# Patient Record
Sex: Female | Born: 1960 | Race: White | Hispanic: No | Marital: Married | Smoking: Never smoker
Health system: Southern US, Community
[De-identification: ages and names within clinical notes are randomized; demographics above are authoritative.]

## PROBLEM LIST (undated history)

## (undated) DIAGNOSIS — I890 Lymphedema, not elsewhere classified: Secondary | ICD-10-CM

## (undated) DIAGNOSIS — K219 Gastro-esophageal reflux disease without esophagitis: Secondary | ICD-10-CM

## (undated) DIAGNOSIS — F32A Depression, unspecified: Secondary | ICD-10-CM

## (undated) DIAGNOSIS — M179 Osteoarthritis of knee, unspecified: Secondary | ICD-10-CM

## (undated) DIAGNOSIS — M1990 Unspecified osteoarthritis, unspecified site: Secondary | ICD-10-CM

## (undated) DIAGNOSIS — I10 Essential (primary) hypertension: Secondary | ICD-10-CM

## (undated) DIAGNOSIS — F329 Major depressive disorder, single episode, unspecified: Secondary | ICD-10-CM

## (undated) DIAGNOSIS — M1710 Unilateral primary osteoarthritis, unspecified knee: Secondary | ICD-10-CM

## (undated) HISTORY — DX: Lymphedema, not elsewhere classified: I89.0

## (undated) HISTORY — DX: Osteoarthritis of knee, unspecified: M17.9

## (undated) HISTORY — DX: Gastro-esophageal reflux disease without esophagitis: K21.9

## (undated) HISTORY — DX: Depression, unspecified: F32.A

## (undated) HISTORY — PX: CHOLECYSTECTOMY: SHX55

## (undated) HISTORY — PX: OTHER SURGICAL HISTORY: SHX169

## (undated) HISTORY — PX: TUBAL LIGATION: SHX77

## (undated) HISTORY — DX: Unilateral primary osteoarthritis, unspecified knee: M17.10

---
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
## 2000-08-01 ENCOUNTER — Emergency Department (HOSPITAL_COMMUNITY): Admission: EM | Admit: 2000-08-01 | Discharge: 2000-08-01 | Payer: Self-pay | Admitting: *Deleted

## 2000-10-07 ENCOUNTER — Emergency Department (HOSPITAL_COMMUNITY): Admission: EM | Admit: 2000-10-07 | Discharge: 2000-10-08 | Payer: Self-pay | Admitting: *Deleted

## 2000-11-02 ENCOUNTER — Emergency Department (HOSPITAL_COMMUNITY): Admission: EM | Admit: 2000-11-02 | Discharge: 2000-11-03 | Payer: Self-pay | Admitting: Emergency Medicine

## 2002-07-12 ENCOUNTER — Emergency Department (HOSPITAL_COMMUNITY): Admission: EM | Admit: 2002-07-12 | Discharge: 2002-07-13 | Payer: Self-pay | Admitting: Emergency Medicine

## 2003-12-09 ENCOUNTER — Emergency Department (HOSPITAL_COMMUNITY): Admission: EM | Admit: 2003-12-09 | Discharge: 2003-12-09 | Payer: Self-pay | Admitting: *Deleted

## 2006-09-27 ENCOUNTER — Emergency Department (HOSPITAL_COMMUNITY): Admission: EM | Admit: 2006-09-27 | Discharge: 2006-09-27 | Payer: Self-pay | Admitting: Emergency Medicine

## 2006-10-29 ENCOUNTER — Emergency Department (HOSPITAL_COMMUNITY): Admission: EM | Admit: 2006-10-29 | Discharge: 2006-10-29 | Payer: Self-pay | Admitting: *Deleted

## 2007-05-17 ENCOUNTER — Emergency Department (HOSPITAL_COMMUNITY): Admission: EM | Admit: 2007-05-17 | Discharge: 2007-05-18 | Payer: Self-pay | Admitting: Emergency Medicine

## 2008-02-21 ENCOUNTER — Ambulatory Visit (HOSPITAL_COMMUNITY): Admission: RE | Admit: 2008-02-21 | Discharge: 2008-02-21 | Payer: Self-pay | Admitting: Pulmonary Disease

## 2008-09-08 ENCOUNTER — Observation Stay (HOSPITAL_COMMUNITY): Admission: EM | Admit: 2008-09-08 | Discharge: 2008-09-09 | Payer: Self-pay | Admitting: Emergency Medicine

## 2009-07-02 ENCOUNTER — Emergency Department (HOSPITAL_COMMUNITY): Admission: EM | Admit: 2009-07-02 | Discharge: 2009-07-02 | Payer: Self-pay | Admitting: Emergency Medicine

## 2010-05-03 LAB — COMPREHENSIVE METABOLIC PANEL
ALT: 14 U/L (ref 0–35)
AST: 12 U/L (ref 0–37)
Albumin: 3.3 g/dL — ABNORMAL LOW (ref 3.5–5.2)
Alkaline Phosphatase: 74 U/L (ref 39–117)
BUN: 13 mg/dL (ref 6–23)
CO2: 25 mEq/L (ref 19–32)
Calcium: 8.7 mg/dL (ref 8.4–10.5)
Chloride: 105 mEq/L (ref 96–112)
Creatinine, Ser: 0.8 mg/dL (ref 0.4–1.2)
GFR calc Af Amer: >60 mL/min (ref >60)
GFR calc non Af Amer: >60 mL/min (ref >60)
Glucose, Bld: 99 mg/dL (ref 70–99)
Potassium: 3.7 mEq/L (ref 3.5–5.1)
Sodium: 135 mEq/L (ref 135–145)
Total Bilirubin: 0.3 mg/dL (ref 0.3–1.2)
Total Protein: 6.7 g/dL (ref 6.0–8.3)

## 2010-05-03 LAB — CBC
HCT: 42 % (ref 36.0–46.0)
Hemoglobin: 14.2 g/dL (ref 12.0–15.0)
MCHC: 33.8 g/dL (ref 30.0–36.0)
MCV: 82.4 fL (ref 78.0–100.0)
Platelets: 273 10*3/uL (ref 150–400)
RBC: 5.09 MIL/uL (ref 3.87–5.11)
RDW: 15.1 % (ref 11.5–15.5)
WBC: 6.6 10*3/uL (ref 4.0–10.5)

## 2010-05-03 LAB — DIFFERENTIAL
Basophils Absolute: 0 10*3/uL (ref 0.0–0.1)
Basophils Relative: 1 % (ref 0–1)
Eosinophils Absolute: 0.1 10*3/uL (ref 0.0–0.7)
Eosinophils Relative: 2 % (ref 0–5)
Lymphocytes Relative: 23 % (ref 12–46)
Lymphs Abs: 1.5 10*3/uL (ref 0.7–4.0)
Monocytes Absolute: 0.3 10*3/uL (ref 0.1–1.0)
Monocytes Relative: 5 % (ref 3–12)
Neutro Abs: 4.6 10*3/uL (ref 1.7–7.7)
Neutrophils Relative %: 70 % (ref 43–77)

## 2010-05-03 LAB — GC/CHLAMYDIA PROBE AMP, GENITAL
Chlamydia, DNA Probe: NEGATIVE
GC Probe Amp, Genital: NEGATIVE

## 2010-05-03 LAB — WET PREP, GENITAL
Trich, Wet Prep: NONE SEEN
Yeast Wet Prep HPF POC: NONE SEEN

## 2010-05-03 LAB — LIPASE, BLOOD: Lipase: 24 U/L (ref 11–59)

## 2010-05-03 LAB — POCT PREGNANCY, URINE: Preg Test, Ur: NEGATIVE

## 2010-05-23 LAB — PROTIME-INR
INR: 1.1 (ref 0.00–1.49)
INR: 1.1 (ref 0.00–1.49)
INR: 1.1 (ref 0.00–1.49)
INR: 1.1 (ref 0.00–1.49)
Prothrombin Time: 14.1 seconds (ref 11.6–15.2)
Prothrombin Time: 14.1 seconds (ref 11.6–15.2)

## 2010-05-23 LAB — BASIC METABOLIC PANEL
BUN: 11 mg/dL (ref 6–23)
BUN: 9 mg/dL (ref 6–23)
CO2: 23 mEq/L (ref 19–32)
CO2: 26 mEq/L (ref 19–32)
Calcium: 8.5 mg/dL (ref 8.4–10.5)
Chloride: 104 mEq/L (ref 96–112)
Chloride: 109 mEq/L (ref 96–112)
Creatinine, Ser: 0.71 mg/dL (ref 0.4–1.2)
Creatinine, Ser: 0.82 mg/dL (ref 0.4–1.2)
GFR calc Af Amer: >60 mL/min (ref >60)
GFR calc Af Amer: >60 mL/min (ref >60)
GFR calc non Af Amer: >60 mL/min (ref >60)
GFR calc non Af Amer: >60 mL/min (ref >60)
Glucose, Bld: 101 mg/dL — ABNORMAL HIGH (ref 70–99)
Glucose, Bld: 105 mg/dL — ABNORMAL HIGH (ref 70–99)
Potassium: 3.3 mEq/L — ABNORMAL LOW (ref 3.5–5.1)
Potassium: 3.6 mEq/L (ref 3.5–5.1)
Potassium: 4.2 mEq/L (ref 3.5–5.1)
Sodium: 136 mEq/L (ref 135–145)

## 2010-05-23 LAB — DIFFERENTIAL
Basophils Absolute: 0 10*3/uL (ref 0.0–0.1)
Basophils Absolute: 0 10*3/uL (ref 0.0–0.1)
Basophils Relative: 0 % (ref 0–1)
Basophils Relative: 0 % (ref 0–1)
Basophils Relative: 0 % (ref 0–1)
Basophils Relative: 1 % (ref 0–1)
Eosinophils Absolute: 0 10*3/uL (ref 0.0–0.7)
Eosinophils Absolute: 0.1 10*3/uL (ref 0.0–0.7)
Eosinophils Absolute: 0.1 10*3/uL (ref 0.0–0.7)
Eosinophils Relative: 0 % (ref 0–5)
Eosinophils Relative: 1 % (ref 0–5)
Eosinophils Relative: 2 % (ref 0–5)
Lymphocytes Relative: 15 % (ref 12–46)
Lymphocytes Relative: 21 % (ref 12–46)
Lymphocytes Relative: 36 % (ref 12–46)
Lymphs Abs: 1.3 10*3/uL (ref 0.7–4.0)
Lymphs Abs: 1.4 10*3/uL (ref 0.7–4.0)
Lymphs Abs: 2.3 10*3/uL (ref 0.7–4.0)
Monocytes Absolute: 0.4 10*3/uL (ref 0.1–1.0)
Monocytes Absolute: 0.4 10*3/uL (ref 0.1–1.0)
Monocytes Absolute: 0.4 10*3/uL (ref 0.1–1.0)
Monocytes Absolute: 0.5 10*3/uL (ref 0.1–1.0)
Monocytes Relative: 4 % (ref 3–12)
Monocytes Relative: 6 % (ref 3–12)
Monocytes Relative: 6 % (ref 3–12)
Monocytes Relative: 7 % (ref 3–12)
Neutro Abs: 3.6 10*3/uL (ref 1.7–7.7)
Neutro Abs: 7.4 10*3/uL (ref 1.7–7.7)
Neutrophils Relative %: 56 % (ref 43–77)
Neutrophils Relative %: 69 % (ref 43–77)
Neutrophils Relative %: 81 % — ABNORMAL HIGH (ref 43–77)

## 2010-05-23 LAB — CBC
HCT: 38.3 % (ref 36.0–46.0)
HCT: 39.7 % (ref 36.0–46.0)
HCT: 41.3 % (ref 36.0–46.0)
HCT: 43.5 % (ref 36.0–46.0)
Hemoglobin: 13.3 g/dL (ref 12.0–15.0)
Hemoglobin: 14.1 g/dL (ref 12.0–15.0)
Hemoglobin: 14.8 g/dL (ref 12.0–15.0)
MCHC: 34 g/dL (ref 30.0–36.0)
MCHC: 34.1 g/dL (ref 30.0–36.0)
MCHC: 34.6 g/dL (ref 30.0–36.0)
MCV: 83.9 fL (ref 78.0–100.0)
MCV: 84.6 fL (ref 78.0–100.0)
MCV: 84.8 fL (ref 78.0–100.0)
Platelets: 255 10*3/uL (ref 150–400)
Platelets: 257 10*3/uL (ref 150–400)
Platelets: 265 10*3/uL (ref 150–400)
RBC: 4.56 MIL/uL (ref 3.87–5.11)
RBC: 4.88 MIL/uL (ref 3.87–5.11)
RBC: 5.13 MIL/uL — ABNORMAL HIGH (ref 3.87–5.11)
RDW: 14.6 % (ref 11.5–15.5)
RDW: 14.9 % (ref 11.5–15.5)
RDW: 15.1 % (ref 11.5–15.5)
WBC: 6.1 10*3/uL (ref 4.0–10.5)
WBC: 6.4 10*3/uL (ref 4.0–10.5)
WBC: 9.2 10*3/uL (ref 4.0–10.5)

## 2010-05-23 LAB — COMPREHENSIVE METABOLIC PANEL
ALT: 14 U/L (ref 0–35)
AST: 14 U/L (ref 0–37)
Albumin: 3.1 g/dL — ABNORMAL LOW (ref 3.5–5.2)
Alkaline Phosphatase: 58 U/L (ref 39–117)
BUN: 10 mg/dL (ref 6–23)
CO2: 24 mEq/L (ref 19–32)
Calcium: 8.5 mg/dL (ref 8.4–10.5)
Chloride: 105 mEq/L (ref 96–112)
Creatinine, Ser: 0.8 mg/dL (ref 0.4–1.2)
GFR calc Af Amer: >60 mL/min (ref >60)
GFR calc non Af Amer: >60 mL/min (ref >60)
Glucose, Bld: 111 mg/dL — ABNORMAL HIGH (ref 70–99)
Potassium: 3.8 mEq/L (ref 3.5–5.1)
Sodium: 138 mEq/L (ref 135–145)
Total Bilirubin: 0.8 mg/dL (ref 0.3–1.2)
Total Protein: 6.1 g/dL (ref 6.0–8.3)

## 2010-05-23 LAB — APTT
aPTT: 29 seconds (ref 24–37)
aPTT: 31 seconds (ref 24–37)

## 2010-05-23 LAB — TSH: TSH: 0.333 u[IU]/mL — ABNORMAL LOW (ref 0.350–4.500)
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
## 2010-06-29 NOTE — Group Therapy Note (Signed)
Doris Miller, Doris Miller              ACCOUNT NO.:  [id number]   MEDICAL RECORD NO.:  [id number]          PATIENT TYPE:  OBV   LOCATION:  A338                          FACILITY:  [facility]   PHYSICIAN:  Melissa L. Ladona Ridgel, MD  DATE OF BIRTH:  September 05, 1960   DATE OF PROCEDURE:  09/08/2008  DATE OF DISCHARGE:                                 PROGRESS NOTE   SUBJECTIVE:  The patient states that her foot is tender to the touch, it  throbs when she hangs it off the side of the bed, it is too sore to walk  on.  She was bitten by a copperhead snake late yesterday afternoon and  was admitted to the hospital at approximately midnight last night.  Her  last tetanus shot was 15 plus years ago.  Her primary concern is  reducing the swelling and being able to walk without difficulty prior to  discharge.   OBJECTIVE:  VITAL SIGNS:  Temperature 98.1, pulse 61, respirations 20,  and blood pressure 104/59.   PERTINENT LABORATORY DATA:  As follows:  White count 9.2, hemoglobin  14.1, hematocrit 41.3, platelets 265,000, PT 14.1, INR 1.1, and PTT 29.  BMETs within normal limits including a BUN of 10 and creatinine 0.8.  LFTs are within normal limits.   PHYSICAL EXAMINATION:  GENERAL:  This is a 50 year old obese female,  Caucasian, who has been out in the sun a good bit and has a tan.  She is  calm, in no apparent distress.  HEAD:  Atraumatic and normocephalic.  EYES:  PERRL and anicteric.  NOSE:  No discharge.  No external lesions.  MOUTH:  She has several teeth missing but in general, no lesions  internally or externally were seen.  NECK:  No lymphadenopathy.  Trachea is midline.  Neck is supple.  LUNGS:  Clear to auscultation bilaterally.  She has got no obvious  wheezes or crackles.  HEART:  Regular rate and rhythm but decreased heart sounds with no  obvious murmurs, rubs, or gallops.  ABDOMEN:  Obese, nontender, and nondistended.  Positive bowel sounds.  EXTREMITIES:  Lower left extremity; the dorsum of  her foot is edematous,  probably 2+ edema.  Her snake bite is on the medial portion of her foot.  There is no erythema around it.  She does have erythema further up her  leg.  She tells me this is normal for her.  It was difficult for me to  find a pulse.  The dorsum of her foot was very tender to palpation;  however, the area of her foot with the snake bite was not.  NEUROLOGIC:  Cranial nerves II through XII are grossly intact and the  patient denied any dizziness or headaches.  She is alert and oriented.  PSYCHIATRIC:  The patient's affect is appropriate.  Judgment and insight  are appropriate.   ASSESSMENT:  This is a 50 year old Caucasian female admitted with:  1. Copperhead snake bite and envenomation.  2. Minor hyperkalemia on admission.  3. Past medical history significant for cholecystectomy and morbid      obesity.   PLAN:  We will recheck labs today later this afternoon and reevaluate  the patient's clinical exam.  We will also contact Dr. Leticia Penna of  Surgery to evaluate the patient for compartment syndrome.  We will order  a tetanus shot to be given to the patient today.  If she is in good  condition this afternoon and able to walk without difficulty, she will  likely be discharged today; if not, hopefully tomorrow.      Stephani Police, PA      Melissa L. Ladona Ridgel, MD  Electronically Signed    MLY/MEDQ  D:  09/08/2008  T:  09/08/2008  Job:  [id number]

## 2010-06-29 NOTE — Group Therapy Note (Signed)
NAMEBRITANY, Doris Miller              ACCOUNT NO.:  [id number]   MEDICAL RECORD NO.:  [id number]          PATIENT TYPE:  OBV   LOCATION:  A338                          FACILITY:  [facility]   PHYSICIAN:  Melissa L. Ladona Ridgel, MD  DATE OF BIRTH:  February 27, 1960   DATE OF PROCEDURE:  DATE OF DISCHARGE:                                 PROGRESS NOTE   This patient was admitted for a copper head bite of the foot.  I have  seen and evaluated this patient.  Currently she remains with swelling of  the arch of the left foot with some edema in the calf.  We have asked  Dr. Leticia Penna to evaluate her for possible compartment syndrome, which at  this time, I do not believe she has but has the potential for.   I reviewed the patient's laboratories this morning with our physician's  assistant and repeat studies this afternoon show no evidence of  coagulopathy.  Currently her hemoglobin is stable since this morning and  her BUN and creatinine are also stable.  Of note, her TSH was obtained  and is 0.333 which is slightly lower than usual.  We will hold off on  testing that but consider following that up as an outpatient.  Please  see the previously dictated and written admission note.  We will follow  her overnight to assure that there is no worsening in the leg swelling  and if possible will discharge her in the morning.      Melissa L. Ladona Ridgel, MD  Electronically Signed     MLT/MEDQ  D:  09/08/2008  T:  09/08/2008  Job:  [id number]

## 2010-06-29 NOTE — Discharge Summary (Signed)
NAMENABILAH, DAVOLI              ACCOUNT NO.:  [id number]   MEDICAL RECORD NO.:  [id number]          PATIENT TYPE:  OBV   LOCATION:  A338                          FACILITY:  [facility]   PHYSICIAN:  Melissa L. Ladona Ridgel, MD  DATE OF BIRTH:  02/27/1960   DATE OF ADMISSION:  09/07/2008  DATE OF DISCHARGE:  LH                               DISCHARGE SUMMARY   HISTORY OF PRESENT ILLNESS:  Ms. Switalski was admitted to [facility] on July 26.  She is being discharged September 09, 2008.   DISCHARGE DIAGNOSES:  1. Copperhead snake bit to the left lower extremity.  2. Obesity.   CONSULTATIONS:  Ms. Deshmukh was seen by Dr. Leticia Penna of Surgery on September 08, 2008, to rule out compartment syndrome.  While he felt that she did  not have compartment syndrome, he advised that we do watch her  overnight.   HISTORY AND BRIEF HOSPITAL COURSE:  Ms. Guagliardo is a 50 year old  Caucasian female who has no significant past medical history.  She came  to the ED on the night of July 26 after having been bitten by a  copperhead snake on her left lower extremity.  She brought the dead  snake with her and it was confirmed to be a copperhead.  She was seen by  Dr. Mikeal Hawthorne who admitted her.  She was given the CroFab antivenom protocol  and was treated conservatively with elevation of her foot and pain  control.  The next morning, she was given tetanus vaccine.  Her foot was  noted to be swollen, she had no pain when she kept it elevated, but when  she hung it over the side of the bed, she said that it was throbbing and  very painful.  She did not try walking.  The morning of July 26, there  was no drainage or redness at the site of the bite, rather the swelling  was on the dorsum her foot.  Dr. Leticia Penna saw her on July 26 as noted  above.  We monitored her overnight, and today, July 27, she tells me  that she is feeling better.  She is able to walk without pain.  She will  be discharged to home in stable condition with a  prescription for  Keflex, instructions to follow up with her primary care physician.   PHYSICAL EXAMINATION:  VITAL SIGNS:  The patient's vital signs are  temperature 98.1, pulse 75, respirations 20, blood pressure is 98/55.  GENERAL:  She is a 50 year old Caucasian female who is obese but in no  acute distress.  HEENT:  Head normocephalic, atraumatic.  Eyes have pupils that are  equal, round, reactive to light.  Sclera clear.  Conjunctivae are pink.  Nose shows no external lesions.  No discharge.  Mouth shows no external  lesions.  Mucous membranes moist.  NECK:  Shows no lymphadenopathy.  She also has no lymphadenopathy in her  femoral area or her axilla.  LUNGS:  Clear to auscultation bilaterally with no wheezes or crackles.  HEART:  Regular rate and rhythm with normal S1-S2.  No S3-S4.  No  obvious murmurs, rubs or gallops.  Upper extremity pulses are equal  bilaterally.  Lower extremity pulses 2+ on the right.  On the left side,  I have a difficult time finding a pulse there, but her extremity is warm  to touch.  ABDOMEN:  Obese, soft, nontender, nondistended with good bowel sounds.  EXTREMITIES:  Her distal lower left extremity is edematous on the dorsum  of her foot.  She also has edema in her distal tibia area.  However, she  tells me this is chronic.  There is also some erythema in her distal  tibia area.  She tells me she has had this for years.  The edema in the  dorsum of her foot is somewhat unchanged from yesterday.  However, it is  much less tender to palpation.  She can flex the foot easily and without  pain.  The site of snakebite is medial in the arch of her foot.  There  is no erythema or drainage or tenderness at the site of the snake bite.   LABORATORY DATA:  Labs today are as follows.  PT 14.2, INR 1.1.  B-met  is within normal limits with potassium of 3.6.  CBC shows a white count  6.1, hemoglobin 13.3, hematocrit 38.3, platelets 232,000.  Her TSH is  0.333, so  she may be a touch hyperthyroid.  Will leave that to  management of Dr. Juanetta Gosling.   DISPOSITION:  She will be discharged to home in stable condition.   INSTRUCTIONS:  1. Please either call your primary care physician or return to the      emergency department with any increase in symptoms such as      swelling, redness or pain in your left lower extremity or nausea,      vomiting, dizziness or fever of over 101.  Follow up with Dr.      Juanetta Gosling in his office on Thursday.  Keep activity level low until      swelling decreases.  Elevate your left foot.   DISCHARGE MEDICATIONS:  Keflex 500 mg one tablet p.o. b.i.d. times 10  days.   TOTAL TIME OF DISCHARGE:  Forty five minutes.  Addendum: This patient was seen and examined by me prior to discharge.  I concur with the summary of her admission and her physical exam.  I  have reviewed and approved of the discharge plan and followup.   Melissa L. Ladona Ridgel, M.D>      Stephani Police, PA      Melissa L. Ladona Ridgel, MD  Electronically Signed    MLY/MEDQ  D:  09/09/2008  T:  09/09/2008  Job:  [id number]   cc:   Ramon Dredge L. Juanetta Gosling, M.D.  Fax: [phone]

## 2010-11-19 ENCOUNTER — Emergency Department (HOSPITAL_COMMUNITY)
Admission: EM | Admit: 2010-11-19 | Discharge: 2010-11-19 | Disposition: A | Discharge status: Home / Self Care | Payer: No Typology Code available for payment source | Attending: Emergency Medicine | Admitting: Emergency Medicine

## 2010-11-19 ENCOUNTER — Encounter: Payer: Self-pay | Admitting: *Deleted

## 2010-11-19 DIAGNOSIS — M545 Low back pain, unspecified: Secondary | ICD-10-CM | POA: Insufficient documentation

## 2010-11-19 DIAGNOSIS — M549 Dorsalgia, unspecified: Secondary | ICD-10-CM

## 2010-11-19 MED ORDER — IBUPROFEN 800 MG PO TABS
800.0000 mg | ORAL_TABLET | Freq: Once | ORAL | Status: AC
  Administered 2010-11-19: 800 mg via ORAL
  Filled 2010-11-19: qty 1

## 2010-11-19 MED ORDER — IBUPROFEN 800 MG PO TABS: 800.0000 mg | ORAL_TABLET | Freq: Three times a day (TID) | ORAL | Status: AC

## 2010-11-19 MED ORDER — HYDROCODONE-ACETAMINOPHEN 5-325 MG PO TABS
2.0000 | ORAL_TABLET | Freq: Once | ORAL | Status: AC
  Administered 2010-11-19: 2 via ORAL
  Filled 2010-11-19: qty 2

## 2010-11-19 MED ORDER — HYDROCODONE-ACETAMINOPHEN 5-325 MG PO TABS: 1.0000 | ORAL_TABLET | ORAL | Status: AC | PRN
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
## 2010-11-19 NOTE — ED Notes (Signed)
Pt resting. States now pain free, "I feel numb" "I feel a lot better"
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
## 2010-11-19 NOTE — ED Notes (Signed)
Pt ambulated out of ER with steady gait no complaints noted
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
## 2011-03-22 ENCOUNTER — Other Ambulatory Visit (HOSPITAL_COMMUNITY): Payer: Self-pay | Admitting: Family Medicine

## 2011-03-22 DIAGNOSIS — Z139 Encounter for screening, unspecified: Secondary | ICD-10-CM

## 2011-03-24 ENCOUNTER — Ambulatory Visit (HOSPITAL_COMMUNITY): Payer: No Typology Code available for payment source

## 2011-04-12 ENCOUNTER — Ambulatory Visit (HOSPITAL_COMMUNITY)
Admission: RE | Admit: 2011-04-12 | Discharge: 2011-04-12 | Disposition: A | Discharge status: Home / Self Care | Payer: PRIVATE HEALTH INSURANCE | Source: Ambulatory Visit | Attending: Family Medicine | Admitting: Family Medicine

## 2011-04-12 DIAGNOSIS — R920 Mammographic microcalcification found on diagnostic imaging of breast: Secondary | ICD-10-CM | POA: Insufficient documentation

## 2011-04-12 DIAGNOSIS — Z1231 Encounter for screening mammogram for malignant neoplasm of breast: Secondary | ICD-10-CM | POA: Insufficient documentation

## 2011-04-12 DIAGNOSIS — Z139 Encounter for screening, unspecified: Secondary | ICD-10-CM

## 2011-04-15 ENCOUNTER — Other Ambulatory Visit: Payer: Self-pay | Admitting: Family Medicine

## 2011-04-15 DIAGNOSIS — R928 Other abnormal and inconclusive findings on diagnostic imaging of breast: Secondary | ICD-10-CM

## 2011-04-25 ENCOUNTER — Other Ambulatory Visit (HOSPITAL_COMMUNITY): Payer: Self-pay | Admitting: Family Medicine

## 2011-04-25 DIAGNOSIS — R928 Other abnormal and inconclusive findings on diagnostic imaging of breast: Secondary | ICD-10-CM

## 2011-05-04 ENCOUNTER — Other Ambulatory Visit (HOSPITAL_COMMUNITY): Payer: Self-pay | Admitting: Family Medicine

## 2011-05-04 ENCOUNTER — Ambulatory Visit (HOSPITAL_COMMUNITY)
Admission: RE | Admit: 2011-05-04 | Discharge: 2011-05-04 | Disposition: A | Discharge status: Home / Self Care | Payer: PRIVATE HEALTH INSURANCE | Source: Ambulatory Visit | Attending: Family Medicine | Admitting: Family Medicine

## 2011-05-04 DIAGNOSIS — R928 Other abnormal and inconclusive findings on diagnostic imaging of breast: Secondary | ICD-10-CM

## 2011-05-04 DIAGNOSIS — N63 Unspecified lump in unspecified breast: Secondary | ICD-10-CM | POA: Insufficient documentation

## 2011-06-15 ENCOUNTER — Encounter (HOSPITAL_COMMUNITY): Payer: Self-pay | Admitting: *Deleted

## 2011-06-15 ENCOUNTER — Emergency Department (HOSPITAL_COMMUNITY): Payer: Self-pay

## 2011-06-15 ENCOUNTER — Emergency Department (HOSPITAL_COMMUNITY)
Admission: EM | Admit: 2011-06-15 | Discharge: 2011-06-15 | Disposition: A | Discharge status: Home / Self Care | Payer: Self-pay | Attending: Emergency Medicine | Admitting: Emergency Medicine

## 2011-06-15 DIAGNOSIS — R509 Fever, unspecified: Secondary | ICD-10-CM | POA: Insufficient documentation

## 2011-06-15 DIAGNOSIS — R51 Headache: Secondary | ICD-10-CM | POA: Insufficient documentation

## 2011-06-15 DIAGNOSIS — B349 Viral infection, unspecified: Secondary | ICD-10-CM

## 2011-06-15 DIAGNOSIS — M542 Cervicalgia: Secondary | ICD-10-CM | POA: Insufficient documentation

## 2011-06-15 DIAGNOSIS — R112 Nausea with vomiting, unspecified: Secondary | ICD-10-CM | POA: Insufficient documentation

## 2011-06-15 DIAGNOSIS — B9789 Other viral agents as the cause of diseases classified elsewhere: Secondary | ICD-10-CM | POA: Insufficient documentation

## 2011-06-15 DIAGNOSIS — R059 Cough, unspecified: Secondary | ICD-10-CM | POA: Insufficient documentation

## 2011-06-15 DIAGNOSIS — R05 Cough: Secondary | ICD-10-CM | POA: Insufficient documentation

## 2011-06-15 LAB — URINALYSIS, ROUTINE W REFLEX MICROSCOPIC
Ketones, ur: NEGATIVE mg/dL
Leukocytes, UA: NEGATIVE
Nitrite: NEGATIVE
Protein, ur: NEGATIVE mg/dL
pH: 6 (ref 5.0–8.0)

## 2011-06-15 LAB — CBC
HCT: 41.2 % (ref 36.0–46.0)
Hemoglobin: 13.4 g/dL (ref 12.0–15.0)
MCHC: 32.5 g/dL (ref 30.0–36.0)
MCV: 84.3 fL (ref 78.0–100.0)
RDW: 14.6 % (ref 11.5–15.5)

## 2011-06-15 LAB — COMPREHENSIVE METABOLIC PANEL
Alkaline Phosphatase: 74 U/L (ref 39–117)
BUN: 8 mg/dL (ref 6–23)
Creatinine, Ser: 0.78 mg/dL (ref 0.50–1.10)
GFR calc Af Amer: >90 mL/min (ref >90)
Glucose, Bld: 109 mg/dL — ABNORMAL HIGH (ref 70–99)
Potassium: 3.4 mEq/L — ABNORMAL LOW (ref 3.5–5.1)
Total Bilirubin: 0.5 mg/dL (ref 0.3–1.2)
Total Protein: 7 g/dL (ref 6.0–8.3)

## 2011-06-15 MED ORDER — OXYCODONE-ACETAMINOPHEN 5-325 MG PO TABS
1.0000 | ORAL_TABLET | Freq: Once | ORAL | Status: AC
  Administered 2011-06-15: 1 via ORAL
  Filled 2011-06-15: qty 1

## 2011-06-15 MED ORDER — HYDROCODONE-ACETAMINOPHEN 5-325 MG PO TABS: 1.0000 | ORAL_TABLET | Freq: Four times a day (QID) | ORAL | Status: AC | PRN

## 2011-06-15 NOTE — ED Notes (Signed)
Headache, fever, vomiting. Legs cramping

## 2011-06-15 NOTE — Discharge Instructions (Signed)
Plenty of fluids and rest.  Follow up with your md Friday if not improving.

## 2011-06-15 NOTE — ED Provider Notes (Cosign Needed)
History  This chart was scribed for Doris Lennert, MD by Doris Miller. The patient was seen in room APA05/APA05. Patient's care was started at 1759.  CSN: [id number]  Arrival date & time 06/15/11  1759   First MD Initiated Contact with Patient 06/15/11 1957      Chief Complaint  Patient presents with  . Fever    (Consider location/radiation/quality/duration/timing/severity/associated sxs/prior treatment) Patient is a 51 y.o. female presenting with fever and headaches. The history is provided by the patient. No language interpreter was used.  Fever Primary symptoms of the febrile illness include fever, headaches, cough, nausea and vomiting. Primary symptoms do not include fatigue, shortness of breath, abdominal pain, diarrhea or rash. The current episode started today. This is a new problem. The problem has not changed since onset. Headache  This is a new problem. The current episode started 6 to 12 hours ago. The problem occurs constantly. The problem has not changed since onset.The headache is associated with nothing. The pain is located in the occipital region. The pain is moderate. The pain radiates to the face. Associated symptoms include a fever, nausea and vomiting. Pertinent negatives include no shortness of breath.    Doris Miller is a 52 y.o. female who presents to the Emergency Department complaining of 12 hours of sudden onset, unchanging, constant, moderate headache localized to the occipital region of the head and radiating to behind the left eye with associated nausea, chills, fever, coughing, and vomiting. Pt denies any modifying factors. Pt states that symptoms began this morning. Pt denies sore throat, congestion, SOB, and back pain.  History reviewed. No pertinent past medical history.  Past Surgical History  Procedure Date  . Tubal ligation   . Cholecystectomy     History reviewed. No pertinent family history.  History  Substance Use Topics  . Smoking  status: Never Smoker   . Smokeless tobacco: Not on file  . Alcohol Use: No    OB History    Grav Para Term Preterm Abortions TAB SAB Ect Mult Living                  Review of Systems  Constitutional: Positive for fever and chills. Negative for fatigue.  HENT: Positive for neck pain. Negative for congestion, sinus pressure and ear discharge.   Eyes: Negative for discharge.  Respiratory: Positive for cough. Negative for shortness of breath.   Cardiovascular: Negative for chest pain.  Gastrointestinal: Positive for nausea and vomiting. Negative for abdominal pain and diarrhea.  Genitourinary: Negative for frequency and hematuria.  Musculoskeletal: Negative for back pain.  Skin: Negative for rash.  Neurological: Positive for headaches. Negative for seizures.  Hematological: Negative.   Psychiatric/Behavioral: Negative for hallucinations.  All other systems reviewed and are negative.    Allergies  Codeine  Home Medications  No current outpatient prescriptions on file.  Triage Vitals: BP 94/67  Pulse 74  Temp(Src) 98.3 F (36.8 C) (Oral)  Resp 18  Ht 5\' 5"  (1.651 m)  Wt 285 lb (129.275 kg)  BMI 47.43 kg/m2  SpO2 97%  LMP 06/13/2011  Physical Exam  Nursing note and vitals reviewed. Constitutional: She is oriented to person, place, and time. She appears well-developed and well-nourished.  HENT:  Head: Normocephalic and atraumatic.       Tenderness left side of forehead  Eyes: Conjunctivae and EOM are normal. No scleral icterus.  Neck: Neck supple. No thyromegaly present.  Cardiovascular: Normal rate and regular rhythm.  Exam reveals no  gallop and no friction rub.   No murmur heard. Pulmonary/Chest: Effort normal. No stridor. She has no wheezes. She has no rales. She exhibits no tenderness.  Abdominal: Soft. She exhibits no distension. There is no tenderness. There is no rebound.  Musculoskeletal: Normal range of motion. She exhibits no edema.  Lymphadenopathy:     She has no cervical adenopathy.  Neurological: She is oriented to person, place, and time. Coordination normal.  Skin: Skin is warm and dry. No rash noted. No erythema.  Psychiatric: She has a normal mood and affect. Her behavior is normal.    ED Course  Procedures (including critical care time)  DIAGNOSTIC STUDIES: Oxygen Saturation is 98% on room air, normal by my interpretation.    COORDINATION OF CARE: 8:08PM - Patient understands and agrees with initial ED impression and plan with expectations set for ED visit. 10:48PM - Discussed test results and discharge with pt. Pt agrees.     Results for orders placed during the hospital encounter of 06/15/11  CBC      Component Value Range   WBC 7.1  4.0 - 10.5 (K/uL)   RBC 4.89  3.87 - 5.11 (MIL/uL)   Hemoglobin 13.4  12.0 - 15.0 (g/dL)   HCT 40.9  81.1 - 91.4 (%)   MCV 84.3  78.0 - 100.0 (fL)   MCH 27.4  26.0 - 34.0 (pg)   MCHC 32.5  30.0 - 36.0 (g/dL)   RDW 78.2  95.6 - 21.3 (%)   Platelets 266  150 - 400 (K/uL)  COMPREHENSIVE METABOLIC PANEL      Component Value Range   Sodium 137  135 - 145 (mEq/L)   Potassium 3.4 (*) 3.5 - 5.1 (mEq/L)   Chloride 104  96 - 112 (mEq/L)   CO2 22  19 - 32 (mEq/L)   Glucose, Bld 109 (*) 70 - 99 (mg/dL)   BUN 8  6 - 23 (mg/dL)   Creatinine, Ser 0.86  0.50 - 1.10 (mg/dL)   Calcium 9.2  8.4 - 57.8 (mg/dL)   Total Protein 7.0  6.0 - 8.3 (g/dL)   Albumin 3.2 (*) 3.5 - 5.2 (g/dL)   AST 12  0 - 37 (U/L)   ALT 15  0 - 35 (U/L)   Alkaline Phosphatase 74  39 - 117 (U/L)   Total Bilirubin 0.5  0.3 - 1.2 (mg/dL)   GFR calc non Af Amer >90  >90 (mL/min)   GFR calc Af Amer >90  >90 (mL/min)  URINALYSIS, ROUTINE W REFLEX MICROSCOPIC      Component Value Range   Color, Urine YELLOW  YELLOW    APPearance CLEAR  CLEAR    Specific Gravity, Urine 1.010  1.005 - 1.030    pH 6.0  5.0 - 8.0    Glucose, UA NEGATIVE  NEGATIVE (mg/dL)   Hgb urine dipstick NEGATIVE  NEGATIVE    Bilirubin Urine NEGATIVE   NEGATIVE    Ketones, ur NEGATIVE  NEGATIVE (mg/dL)   Protein, ur NEGATIVE  NEGATIVE (mg/dL)   Urobilinogen, UA 0.2  0.0 - 1.0 (mg/dL)   Nitrite NEGATIVE  NEGATIVE    Leukocytes, UA NEGATIVE  NEGATIVE    Dg Chest 2 View  06/15/2011  *RADIOLOGY REPORT*  Clinical Data: Pain, fever, chills  CHEST - 2 VIEW  Comparison: 10/29/2006  Findings: Cardiomediastinal silhouette is stable.  No acute infiltrate or pleural effusion.  No pulmonary edema.  Bony thorax is stable.  IMPRESSION: No active disease.  No significant  change.  Original Report Authenticated By: Natasha Mead, M.D.      No diagnosis found.    MDM  Cough,  Headache chills      The chart was scribed for me under my direct supervision.  I personally performed the history, physical, and medical decision making and all procedures in the evaluation of this patient.Doris Lennert, MD 06/15/11 [phone]

## 2011-10-28 ENCOUNTER — Other Ambulatory Visit (HOSPITAL_COMMUNITY): Payer: Self-pay | Admitting: Nurse Practitioner

## 2011-10-28 DIAGNOSIS — Z09 Encounter for follow-up examination after completed treatment for conditions other than malignant neoplasm: Secondary | ICD-10-CM

## 2011-11-09 ENCOUNTER — Encounter (HOSPITAL_COMMUNITY): Payer: Self-pay

## 2011-12-28 ENCOUNTER — Ambulatory Visit (HOSPITAL_COMMUNITY)
Admission: RE | Admit: 2011-12-28 | Discharge: 2011-12-28 | Disposition: A | Discharge status: Home / Self Care | Payer: PRIVATE HEALTH INSURANCE | Source: Ambulatory Visit | Attending: Nurse Practitioner | Admitting: Nurse Practitioner

## 2011-12-28 ENCOUNTER — Other Ambulatory Visit (HOSPITAL_COMMUNITY): Payer: Self-pay | Admitting: Nurse Practitioner

## 2011-12-28 DIAGNOSIS — Z09 Encounter for follow-up examination after completed treatment for conditions other than malignant neoplasm: Secondary | ICD-10-CM

## 2011-12-28 DIAGNOSIS — R928 Other abnormal and inconclusive findings on diagnostic imaging of breast: Secondary | ICD-10-CM | POA: Insufficient documentation

## 2013-10-25 ENCOUNTER — Emergency Department (HOSPITAL_COMMUNITY)
Admission: EM | Admit: 2013-10-25 | Discharge: 2013-10-25 | Disposition: A | Discharge status: Home / Self Care | Payer: No Typology Code available for payment source | Attending: Emergency Medicine | Admitting: Emergency Medicine

## 2013-10-25 ENCOUNTER — Encounter (HOSPITAL_COMMUNITY): Payer: Self-pay | Admitting: Emergency Medicine

## 2013-10-25 DIAGNOSIS — L02419 Cutaneous abscess of limb, unspecified: Secondary | ICD-10-CM | POA: Insufficient documentation

## 2013-10-25 DIAGNOSIS — R110 Nausea: Secondary | ICD-10-CM | POA: Insufficient documentation

## 2013-10-25 DIAGNOSIS — R5381 Other malaise: Secondary | ICD-10-CM | POA: Insufficient documentation

## 2013-10-25 DIAGNOSIS — L03115 Cellulitis of right lower limb: Secondary | ICD-10-CM

## 2013-10-25 DIAGNOSIS — R5383 Other fatigue: Secondary | ICD-10-CM

## 2013-10-25 DIAGNOSIS — L03119 Cellulitis of unspecified part of limb: Principal | ICD-10-CM

## 2013-10-25 LAB — CBC WITH DIFFERENTIAL/PLATELET
BASOS PCT: 0 % (ref 0–1)
Basophils Absolute: 0 10*3/uL (ref 0.0–0.1)
EOS ABS: 0.1 10*3/uL (ref 0.0–0.7)
EOS PCT: 1 % (ref 0–5)
HEMATOCRIT: 44.6 % (ref 36.0–46.0)
HEMOGLOBIN: 14.8 g/dL (ref 12.0–15.0)
Lymphocytes Relative: 23 % (ref 12–46)
Lymphs Abs: 2 10*3/uL (ref 0.7–4.0)
MCH: 28.9 pg (ref 26.0–34.0)
MCHC: 33.2 g/dL (ref 30.0–36.0)
MCV: 87.1 fL (ref 78.0–100.0)
MONO ABS: 0.5 10*3/uL (ref 0.1–1.0)
MONOS PCT: 6 % (ref 3–12)
NEUTROS PCT: 70 % (ref 43–77)
Neutro Abs: 6.2 10*3/uL (ref 1.7–7.7)
Platelets: 260 10*3/uL (ref 150–400)
RBC: 5.12 MIL/uL — ABNORMAL HIGH (ref 3.87–5.11)
RDW: 14 % (ref 11.5–15.5)
WBC: 8.8 10*3/uL (ref 4.0–10.5)

## 2013-10-25 LAB — COMPREHENSIVE METABOLIC PANEL
ALBUMIN: 3.4 g/dL — AB (ref 3.5–5.2)
ALT: 16 U/L (ref 0–35)
ANION GAP: 10 (ref 5–15)
AST: 14 U/L (ref 0–37)
Alkaline Phosphatase: 80 U/L (ref 39–117)
BUN: 11 mg/dL (ref 6–23)
CALCIUM: 9.2 mg/dL (ref 8.4–10.5)
CO2: 27 mEq/L (ref 19–32)
CREATININE: 0.85 mg/dL (ref 0.50–1.10)
Chloride: 104 mEq/L (ref 96–112)
GFR calc non Af Amer: 78 mL/min — ABNORMAL LOW (ref >90)
Glucose, Bld: 91 mg/dL (ref 70–99)
Potassium: 3.8 mEq/L (ref 3.7–5.3)
Sodium: 141 mEq/L (ref 137–147)
TOTAL PROTEIN: 6.9 g/dL (ref 6.0–8.3)
Total Bilirubin: 0.6 mg/dL (ref 0.3–1.2)

## 2013-10-25 MED ORDER — HYDROCODONE-ACETAMINOPHEN 5-325 MG PO TABS: 1.0000 | ORAL_TABLET | ORAL | Status: DC | PRN

## 2013-10-25 MED ORDER — SULFAMETHOXAZOLE-TRIMETHOPRIM 800-160 MG PO TABS
1.0000 | ORAL_TABLET | Freq: Two times a day (BID) | ORAL | Status: DC
Start: 2013-10-25 — End: 2019-03-08

## 2013-10-25 MED ORDER — VANCOMYCIN HCL IN DEXTROSE 1-5 GM/200ML-% IV SOLN
1000.0000 mg | Freq: Once | INTRAVENOUS | Status: AC
  Administered 2013-10-25: 1000 mg via INTRAVENOUS
  Filled 2013-10-25: qty 200

## 2013-10-25 MED ORDER — ONDANSETRON HCL 4 MG/2ML IJ SOLN
4.0000 mg | Freq: Once | INTRAMUSCULAR | Status: AC
  Administered 2013-10-25: 4 mg via INTRAMUSCULAR
  Filled 2013-10-25: qty 2

## 2013-10-25 MED ORDER — CEPHALEXIN 500 MG PO CAPS: 500.0000 mg | ORAL_CAPSULE | Freq: Four times a day (QID) | ORAL | Status: DC

## 2013-10-25 NOTE — ED Notes (Signed)
?  Insect bite to post rt lower leg last night Redness present

## 2013-10-25 NOTE — ED Provider Notes (Signed)
CSN: [id number]     Arrival date & time 10/25/13  1407 History   First MD Initiated Contact with Patient 10/25/13 1445     No chief complaint on file.    (Consider location/radiation/quality/duration/timing/severity/associated sxs/prior Treatment) The history is provided by the patient.   Doris Miller is a 53 y.o. female presenting with pain and redness of her right lower leg which she noticed last night as she was getting into bed.  She describes spending a good part of yesterday working outdoors, and her husband at the bedside endorses they have lots of spiders and suspects this is the result of a spider bite, however she does not recall being bit.  Her pain is burning in quality and worsened with palpation.  She also endorses mild nausea without abdominal pain and has had no fevers or chills.  She endorses increased fatigue today. There is been no drainage from the infected site.  She has no contributory past medical history.  She has taken no medications or treatments for this condition prior to arrival.     History reviewed. No pertinent past medical history. Past Surgical History  Procedure Laterality Date  . Tubal ligation    . Cholecystectomy     History reviewed. No pertinent family history. History  Substance Use Topics  . Smoking status: Never Smoker   . Smokeless tobacco: Not on file  . Alcohol Use: No   OB History   Grav Para Term Preterm Abortions TAB SAB Ect Mult Living                 Review of Systems  Constitutional: Positive for fatigue. Negative for fever and chills.  Respiratory: Negative for shortness of breath and wheezing.   Gastrointestinal: Positive for nausea.  Skin: Positive for color change. Negative for wound.  Neurological: Negative for numbness.      Allergies  Codeine  Home Medications   Prior to Admission medications   Medication Sig Start Date End Date Taking? Authorizing Provider  cephALEXin (KEFLEX) 500 MG capsule Take 1  capsule (500 mg total) by mouth 4 (four) times daily. 10/25/13   Burgess Amor, PA-C  HYDROcodone-acetaminophen (NORCO/VICODIN) 5-325 MG per tablet Take 1 tablet by mouth every 4 (four) hours as needed. 10/25/13   Burgess Amor, PA-C  sulfamethoxazole-trimethoprim (SEPTRA DS) 800-160 MG per tablet Take 1 tablet by mouth every 12 (twelve) hours. 10/25/13   Burgess Amor, PA-C   BP 132/94  Pulse 89  Temp(Src) 98.4 F (36.9 C) (Oral)  Resp 18  Ht  (1.651 m)  Wt 285 lb (129.275 kg)  BMI 47.43 kg/m2  SpO2 100% Physical Exam  Nursing note and vitals reviewed. Constitutional: She appears well-developed and well-nourished. No distress.  HENT:  Head: Normocephalic.  Neck: Neck supple.  Cardiovascular: Normal rate and regular rhythm.   Pulses:      Dorsalis pedis pulses are 2+ on the right side, and 2+ on the left side.  Pulmonary/Chest: Effort normal. She has no wheezes. She has no rales.  Abdominal: She exhibits no distension. There is no tenderness. There is no rebound and no guarding.  Musculoskeletal: Normal range of motion.  Skin: There is erythema.  Patient has intensely confluent erythematous skin right posterior lower extremity which is tender to even mild palpation.  There is a distinct border along her lower calf.  There there is lighter and more patchy areas of faint erythema anterior distal extremity, foot sparing.  There is no red streaking, no  induration or fluctuance.  Skin is warm.    ED Course  Procedures (including critical care time) Labs Review Labs Reviewed  CBC WITH DIFFERENTIAL - Abnormal; Notable for the following:    RBC 5.12 (*)    All other components within normal limits  COMPREHENSIVE METABOLIC PANEL - Abnormal; Notable for the following:    Albumin 3.4 (*)    GFR calc non Af Amer 78 (*)    All other components within normal limits    Imaging Review No results found.   EKG Interpretation None      MDM   Final diagnoses:  Cellulitis of right lower  extremity    Labs reviewed and normal.  Patient was given a 1 g dose of vancomycin per IV.  Discussed admission for further IV antibiotics, however patient is reluctant to stay, given preference to a followup recheck here tomorrow morning.  Given normal labs and no defervesced since during this ED visit, this is a reasonable option.  She was prescribed Bactrim and Keflex and instructed to take a dose of these medications this evening.  Prescribed hydrocodone when necessary pain.  Elevation, warm compresses will see patient back tomorrow for recheck.  Infection edges were marked with a skin marker pen.  Burgess Amor, PA-C 10/25/13 1912

## 2013-10-25 NOTE — Discharge Instructions (Signed)

## 2013-10-26 ENCOUNTER — Encounter (HOSPITAL_COMMUNITY): Payer: Self-pay | Admitting: Emergency Medicine

## 2013-10-26 ENCOUNTER — Emergency Department (HOSPITAL_COMMUNITY)
Admission: EM | Admit: 2013-10-26 | Discharge: 2013-10-26 | Disposition: A | Discharge status: Home / Self Care | Payer: No Typology Code available for payment source | Attending: Emergency Medicine | Admitting: Emergency Medicine

## 2013-10-26 DIAGNOSIS — Z792 Long term (current) use of antibiotics: Secondary | ICD-10-CM | POA: Insufficient documentation

## 2013-10-26 DIAGNOSIS — Z4801 Encounter for change or removal of surgical wound dressing: Secondary | ICD-10-CM | POA: Insufficient documentation

## 2013-10-26 DIAGNOSIS — L03115 Cellulitis of right lower limb: Secondary | ICD-10-CM

## 2013-10-26 DIAGNOSIS — L02419 Cutaneous abscess of limb, unspecified: Secondary | ICD-10-CM | POA: Insufficient documentation

## 2013-10-26 DIAGNOSIS — L03119 Cellulitis of unspecified part of limb: Secondary | ICD-10-CM

## 2013-10-26 NOTE — ED Notes (Signed)
Pt to have right lower leg rechecked due to a "spider bite", pt and pt's husband states it looks better and pt states less pain than yesterday

## 2013-10-26 NOTE — Discharge Instructions (Signed)
Cellulitis Cellulitis is an infection of the skin and the tissue under the skin. The infected area is usually red and tender. This happens most often in the arms and lower legs. HOME CARE   Take your antibiotic medicine as told. Finish the medicine even if you start to feel better.  Keep the infected arm or leg raised (elevated).  Put a warm cloth on the area up to 4 times per day.  Only take medicines as told by your doctor.  Keep all doctor visits as told. GET HELP IF:  You see red streaks on the skin coming from the infected area.  Your red area gets bigger or turns a dark color.  Your bone or joint under the infected area is painful after the skin heals.  Your infection comes back in the same area or different area.  You have a puffy (swollen) bump in the infected area.  You have new symptoms.  You have a fever. GET HELP RIGHT AWAY IF:   You feel very sleepy.  You throw up (vomit) or have watery poop (diarrhea).  You feel sick and have muscle aches and pains. MAKE SURE YOU:   Understand these instructions.  Will watch your condition.  Will get help right away if you are not doing well or get worse. Document Released: 07/20/2007 Document Revised: 06/17/2013 Document Reviewed: 04/18/2011 [facility] Patient Information 2015 [place], [place]. This information is not intended to replace advice given to you by your health care provider. Make sure you discuss any questions you have with your health care provider.   Continue taking your antibiotics until completed.  Elevation and warm compresses will still also be helpful.  Return here or see your doctor if you develop any worsening symptoms.  Your infection looks much better today.

## 2013-10-26 NOTE — ED Provider Notes (Signed)
Medical screening examination/treatment/procedure(s) were performed by non-physician practitioner and as supervising physician I was immediately available for consultation/collaboration.   EKG Interpretation None        Benny Lennert, MD 10/26/13 [phone]

## 2013-10-26 NOTE — ED Notes (Addendum)
Pt reports was bitten by a spider yesterday. Pt reports was seen for same yesterday and told to come back to see if swelling had decreased to RLE. Site margins marked at time of ambulation into triage. nad noted. Mild redness noted on RLE. Redness within limits of margins.Pt reports "it looks a lot better today." pt denies any fever,n/v.

## 2013-10-28 NOTE — ED Provider Notes (Signed)
CSN: [id number]     Arrival date & time 10/26/13  1135 History   First MD Initiated Contact with Patient 10/26/13 1150     Chief Complaint  Patient presents with  . Wound Check     (Consider location/radiation/quality/duration/timing/severity/associated sxs/prior Treatment) The history is provided by the patient and the spouse.   Doris Miller is a 53 y.o. female presenting for a 24 hour cellulitis recheck.  She was seen here yesterday by me and treated for a cellulitis of her right lower leg, presumably triggered by a spider bite.  She was given IV vancomycin while here and prescribed keflex and bactrim.  She has had 2 doses of the home antibiotics since seen here. She has been using warm compresses and elevation and reports great improvement in both the pain and the distribution of the infection.  She denies fevers or chills, nausea, vomiting.  No additional or worsened symptoms today.    History reviewed. No pertinent past medical history. Past Surgical History  Procedure Laterality Date  . Tubal ligation    . Cholecystectomy     History reviewed. No pertinent family history. History  Substance Use Topics  . Smoking status: Never Smoker   . Smokeless tobacco: Not on file  . Alcohol Use: No   OB History   Grav Para Term Preterm Abortions TAB SAB Ect Mult Living                 Review of Systems  Constitutional: Negative for fever and chills.  Respiratory: Negative for shortness of breath and wheezing.   Skin: Positive for color change.  Neurological: Negative for numbness.      Allergies  Codeine  Home Medications   Prior to Admission medications   Medication Sig Start Date End Date Taking? Authorizing Provider  cephALEXin (KEFLEX) 500 MG capsule Take 1 capsule (500 mg total) by mouth 4 (four) times daily. 10/25/13   Burgess Amor, PA-C  HYDROcodone-acetaminophen (NORCO/VICODIN) 5-325 MG per tablet Take 1 tablet by mouth every 4 (four) hours as needed. 10/25/13    Burgess Amor, PA-C  sulfamethoxazole-trimethoprim (SEPTRA DS) 800-160 MG per tablet Take 1 tablet by mouth every 12 (twelve) hours. 10/25/13   Burgess Amor, PA-C   BP 143/91  Pulse 63  Temp(Src) 98.2 F (36.8 C) (Oral)  Resp 18  Ht  (1.651 m)  Wt 285 lb (129.275 kg)  BMI 47.43 kg/m2  SpO2 98% Physical Exam  Constitutional: She appears well-developed and well-nourished. No distress.  HENT:  Head: Normocephalic.  Neck: Neck supple.  Cardiovascular: Normal rate.   Pulmonary/Chest: Effort normal. She has no wheezes.  Musculoskeletal: Normal range of motion. She exhibits no edema.  Skin: There is erythema.  Area of erythema is well within the marked areas from yesterday.  Erythema is much less intense.  No edema, dorsalis pedal pulse intact.  No red streaking.    ED Course  Procedures (including critical care time) Labs Review Labs Reviewed - No data to display  Imaging Review No results found.   EKG Interpretation None      MDM   Final diagnoses:  Cellulitis of right lower extremity    Improving cellulitis of lower extremity.  Pt advised to complete entire course of both abx, continue with elevation, heat as she is currently.  Prn f/u here or by pcp for any sign of defervescence.  The patient appears reasonably screened and/or stabilized for discharge and I doubt any other medical condition or other  EMC requiring further screening, evaluation, or treatment in the ED at this time prior to discharge.     Burgess Amor, PA-C 10/28/13 1710

## 2013-10-29 NOTE — ED Provider Notes (Signed)
Medical screening examination/treatment/procedure(s) were performed by non-physician practitioner and as supervising physician I was immediately available for consultation/collaboration.   EKG Interpretation None       Romie Tay, MD 10/29/13 0048 

## 2017-05-07 ENCOUNTER — Encounter (HOSPITAL_COMMUNITY): Payer: Self-pay | Admitting: *Deleted

## 2017-05-07 ENCOUNTER — Emergency Department (HOSPITAL_COMMUNITY)
Admission: EM | Admit: 2017-05-07 | Discharge: 2017-05-08 | Disposition: A | Discharge status: Home / Self Care | Payer: Self-pay | Attending: Emergency Medicine | Admitting: Emergency Medicine

## 2017-05-07 ENCOUNTER — Other Ambulatory Visit: Payer: Self-pay

## 2017-05-07 DIAGNOSIS — I10 Essential (primary) hypertension: Secondary | ICD-10-CM | POA: Insufficient documentation

## 2017-05-07 DIAGNOSIS — T7840XA Allergy, unspecified, initial encounter: Secondary | ICD-10-CM | POA: Insufficient documentation

## 2017-05-07 HISTORY — DX: Essential (primary) hypertension: I10

## 2017-05-07 HISTORY — DX: Unspecified osteoarthritis, unspecified site: M19.90

## 2017-05-07 LAB — CBG MONITORING, ED: Glucose-Capillary: 112 mg/dL — ABNORMAL HIGH (ref 65–99)

## 2017-05-07 MED ORDER — METHYLPREDNISOLONE SODIUM SUCC 125 MG IJ SOLR
125.0000 mg | Freq: Once | INTRAMUSCULAR | Status: AC
  Administered 2017-05-07: 125 mg via INTRAVENOUS

## 2017-05-07 MED ORDER — METHYLPREDNISOLONE SODIUM SUCC 125 MG IJ SOLR
INTRAMUSCULAR | Status: AC
  Filled 2017-05-07: qty 2

## 2017-05-07 MED ORDER — DIPHENHYDRAMINE HCL 50 MG/ML IJ SOLN
INTRAMUSCULAR | Status: AC
  Filled 2017-05-07: qty 1

## 2017-05-07 MED ORDER — DIPHENHYDRAMINE HCL 50 MG/ML IJ SOLN
25.0000 mg | Freq: Once | INTRAMUSCULAR | Status: AC
  Administered 2017-05-07: 25 mg via INTRAVENOUS

## 2017-05-07 MED ORDER — FAMOTIDINE IN NACL 20-0.9 MG/50ML-% IV SOLN
20.0000 mg | Freq: Once | INTRAVENOUS | Status: AC
  Administered 2017-05-07: 20 mg via INTRAVENOUS

## 2017-05-07 MED ORDER — FAMOTIDINE IN NACL 20-0.9 MG/50ML-% IV SOLN
INTRAVENOUS | Status: DC
Start: 2017-05-07 — End: 2017-05-08
  Filled 2017-05-07: qty 50

## 2017-05-07 NOTE — ED Provider Notes (Signed)
[facility] EMERGENCY DEPARTMENT Provider Note   CSN: [id number] Arrival date & time: 05/07/17  2301     History   Chief Complaint Chief Complaint  Patient presents with  . Allergic Reaction    HPI Doris Miller is a 57 y.o. female.  Patient presents with diffuse itchy red rash that onset this evening.  States she was started on a new medicine for arthritis which she has had 2 doses (diclofenac).  She took 1 pill yesterday and one today around 10 AM.  She did not have any reaction until this evening.  She describes itchy red rash to her arms, legs, back, thighs and trunk.  She did not take any medication at home.  She denies any chest pain or trouble breathing.  No tongue or lip swelling.  Denies any other medications at this time.  She is been out of her blood pressure medications for several days.  No other new exposures including clothes, hygiene products, foods.  The history is provided by the patient.  Allergic Reaction  Presenting symptoms: rash     Past Medical History:  Diagnosis Date  . Arthritis   . Hypertension     There are no active problems to display for this patient.   Past Surgical History:  Procedure Laterality Date  . CHOLECYSTECTOMY    . TUBAL LIGATION       OB History   None      Home Medications    Prior to Admission medications   Medication Sig Start Date End Date Taking? Authorizing Provider  cephALEXin (KEFLEX) 500 MG capsule Take 1 capsule (500 mg total) by mouth 4 (four) times daily. 10/25/13   Burgess AmorIdol, Julie, PA-C  HYDROcodone-acetaminophen (NORCO/VICODIN) 5-325 MG per tablet Take 1 tablet by mouth every 4 (four) hours as needed. 10/25/13   Burgess AmorIdol, Julie, PA-C  sulfamethoxazole-trimethoprim (SEPTRA DS) 800-160 MG per tablet Take 1 tablet by mouth every 12 (twelve) hours. 10/25/13   Burgess AmorIdol, Julie, PA-C    Family History No family history on file.  Social History Social History   Tobacco Use  . Smoking status: Never Smoker  .  Smokeless tobacco: Never Used  Substance Use Topics  . Alcohol use: No  . Drug use: No     Allergies   Codeine   Review of Systems Review of Systems  Constitutional: Negative for activity change, appetite change and fever.  HENT: Negative for congestion and rhinorrhea.   Respiratory: Negative for cough, chest tightness and shortness of breath.   Cardiovascular: Negative for chest pain.  Gastrointestinal: Negative for abdominal pain, nausea and vomiting.  Genitourinary: Negative for dysuria.  Musculoskeletal: Negative for arthralgias and myalgias.  Skin: Positive for rash.  Neurological: Negative for dizziness, weakness, light-headedness and headaches.    all other systems are negative except as noted in the HPI and PMH.    Physical Exam Updated Vital Signs BP (!) 140/95 (BP Location: Right Arm)   Pulse 100   Temp (!) 97.5 F (36.4 C)   Resp 16   LMP 06/13/2011   SpO2 96%   Physical Exam  Constitutional: She is oriented to person, place, and time. She appears well-developed and well-nourished. No distress.  HENT:  Head: Normocephalic and atraumatic.  Mouth/Throat: Oropharynx is clear and moist. No oropharyngeal exudate.  No tongue or lip swelling  Eyes: Pupils are equal, round, and reactive to light. Conjunctivae and EOM are normal.  Neck: Normal range of motion. Neck supple.  No meningismus.  Cardiovascular:  Normal rate, regular rhythm, normal heart sounds and intact distal pulses.  No murmur heard. Pulmonary/Chest: Effort normal and breath sounds normal. No respiratory distress. She has no wheezes.  Abdominal: Soft. There is no tenderness. There is no rebound and no guarding.  Musculoskeletal: Normal range of motion. She exhibits no edema or tenderness.  Neurological: She is alert and oriented to person, place, and time. No cranial nerve deficit. She exhibits normal muscle tone. Coordination normal.   5/5 strength throughout. CN 2-12 intact.Equal grip strength.     Skin: Skin is warm. Rash noted.  Diffuse urticarial maculopapular rash to bilateral arms, legs and back.  Psychiatric: She has a normal mood and affect. Her behavior is normal.  Nursing note and vitals reviewed.    ED Treatments / Results  Labs (all labs ordered are listed, but only abnormal results are displayed) Labs Reviewed  CBG MONITORING, ED - Abnormal; Notable for the following components:      Result Value   Glucose-Capillary 112 (*)    All other components within normal limits    EKG None  Radiology No results found.  Procedures Procedures (including critical care time)  Medications Ordered in ED Medications  famotidine (PEPCID) IVPB 20 mg premix (20 mg Intravenous New Bag/Given 05/07/17 2344)  diphenhydrAMINE (BENADRYL) injection 25 mg (25 mg Intravenous Given 05/07/17 2344)  methylPREDNISolone sodium succinate (SOLU-MEDROL) 125 mg/2 mL injection 125 mg (125 mg Intravenous Given 05/07/17 2345)     Initial Impression / Assessment and Plan / ED Course  I have reviewed the triage vital signs and the nursing notes.  Pertinent labs & imaging results that were available during my care of the patient were reviewed by me and considered in my medical decision making (see chart for details).    Urticarial reaction possibly to diclofenac.  No other new exposures.  No tongue or lip swelling.  No chest pain or shortness of breath.  No wheezing.  Symptoms have improved after Benadryl, Pepcid and steroids.  She has no chest pain or shortness of breath.  No tongue or lip swelling.  Advised patient to stop diclofenac at this time as this is likely the source of her allergy.  Follow-up with her primary doctor.  Return to the ED with chest pain, shortness of breath, difficulty breathing, difficulty swallowing or any other concerns.  Final Clinical Impressions(s) / ED Diagnoses   Final diagnoses:  Allergic reaction, initial encounter    ED Discharge Orders    None        Montoya Brandel, Jeannett Senior, MD 05/08/17 [phone]

## 2017-05-07 NOTE — ED Notes (Signed)
Pt has not taken any benadryl

## 2017-05-07 NOTE — ED Triage Notes (Signed)
Pt states she started taking diclofenac for arthritis. Pt states she took 1 pill last and 1 this morning. Pt reports itching, redness, and rash to arms, legs, and thighs.

## 2017-05-08 MED ORDER — FAMOTIDINE 20 MG PO TABS: 20.0000 mg | ORAL_TABLET | Freq: Two times a day (BID) | ORAL | 0 refills | Status: DC

## 2017-05-08 MED ORDER — PREDNISONE 50 MG PO TABS: ORAL_TABLET | ORAL | 0 refills | Status: DC

## 2017-05-08 MED ORDER — DIPHENHYDRAMINE HCL 25 MG PO TABS: 25.0000 mg | ORAL_TABLET | Freq: Four times a day (QID) | ORAL | 0 refills | Status: DC

## 2017-05-08 NOTE — ED Notes (Signed)
Pt's redness is decreasing. Pt reports no rash and she is not itching anymore and that she is ready to go home.

## 2017-05-08 NOTE — Discharge Instructions (Addendum)
Stop taking diclofenac.  Follow-up with your doctor.  Take the steroids and antihistamines as prescribed.  Return to the ED if you develop chest pain, shortness of breath, difficulty breathing, difficulty swallowing or any other concerns.

## 2017-08-24 ENCOUNTER — Other Ambulatory Visit (HOSPITAL_COMMUNITY): Payer: Self-pay | Admitting: Pulmonary Disease

## 2017-08-24 ENCOUNTER — Ambulatory Visit (HOSPITAL_COMMUNITY)
Admission: RE | Admit: 2017-08-24 | Discharge: 2017-08-24 | Disposition: A | Discharge status: Home / Self Care | Payer: Medicaid Other | Source: Ambulatory Visit | Attending: Pulmonary Disease | Admitting: Pulmonary Disease

## 2017-08-24 DIAGNOSIS — M5432 Sciatica, left side: Secondary | ICD-10-CM

## 2017-08-24 DIAGNOSIS — M545 Low back pain: Secondary | ICD-10-CM | POA: Diagnosis not present

## 2018-06-22 ENCOUNTER — Ambulatory Visit: Payer: Medicaid Other | Admitting: Podiatry

## 2018-06-22 ENCOUNTER — Other Ambulatory Visit: Payer: Self-pay

## 2018-06-22 VITALS — Temp 98.4°F

## 2018-06-22 DIAGNOSIS — M79675 Pain in left toe(s): Secondary | ICD-10-CM | POA: Diagnosis not present

## 2018-06-22 DIAGNOSIS — M79674 Pain in right toe(s): Secondary | ICD-10-CM

## 2018-06-22 DIAGNOSIS — B351 Tinea unguium: Secondary | ICD-10-CM

## 2018-06-28 ENCOUNTER — Encounter: Payer: Self-pay | Admitting: Podiatry

## 2018-06-28 NOTE — Progress Notes (Signed)
Subjective: Doris Miller presents today referred by [name], Edward, MD with cc of painful, discolored, thick toenails which interfere with daily activities.  Pain is aggravated when wearing enclosed shoe gear. Right great toe is most symptomatic on today's visit. She denies any redness, drainage or swelling. She has tried nothing to treat her condition. Her husband is also seen in our clinic for foot care.  Past Medical History:  Diagnosis Date  . Arthritis   . Hypertension   . Lymphedema   . OA (osteoarthritis) of knee right    There are no active problems to display for this patient.    Past Surgical History:  Procedure Laterality Date  . CHOLECYSTECTOMY    . cyst removal left wrist Left   . TUBAL LIGATION        Current Outpatient Medications:  .  escitalopram (LEXAPRO) 10 MG tablet, Take 10 mg by mouth daily., Disp: , Rfl:  .  lisinopril-hydrochlorothiazide (ZESTORETIC) 10-12.5 MG tablet, Take 1 tablet by mouth daily., Disp: , Rfl:  .  omeprazole (PRILOSEC) 20 MG capsule, Take 20 mg by mouth daily., Disp: , Rfl:  .  rOPINIRole (REQUIP) 0.5 MG tablet, Take 0.5 mg by mouth at bedtime., Disp: , Rfl:  .  cephALEXin (KEFLEX) 500 MG capsule, Take 1 capsule (500 mg total) by mouth 4 (four) times daily. (Patient not taking: Reported on 06/22/2018), Disp: 40 capsule, Rfl: 0 .  diphenhydrAMINE (BENADRYL) 25 MG tablet, Take 1 tablet (25 mg total) by mouth every 6 (six) hours. (Patient not taking: Reported on 06/22/2018), Disp: 20 tablet, Rfl: 0 .  famotidine (PEPCID) 20 MG tablet, Take 1 tablet (20 mg total) by mouth 2 (two) times daily. (Patient not taking: Reported on 06/22/2018), Disp: 30 tablet, Rfl: 0 .  HYDROcodone-acetaminophen (NORCO/VICODIN) 5-325 MG per tablet, Take 1 tablet by mouth every 4 (four) hours as needed. (Patient not taking: Reported on 06/22/2018), Disp: 20 tablet, Rfl: 0 .  predniSONE (DELTASONE) 50 MG tablet, 1 tablet PO daily (Patient not taking: Reported on 06/22/2018),  Disp: 5 tablet, Rfl: 0 .  sulfamethoxazole-trimethoprim (SEPTRA DS) 800-160 MG per tablet, Take 1 tablet by mouth every 12 (twelve) hours. (Patient not taking: Reported on 06/22/2018), Disp: 20 tablet, Rfl: 0   Allergies  Allergen Reactions  . Codeine Shortness Of Breath  . Diclofenac Rash     Social History   Occupational History  . Not on file  Tobacco Use  . Smoking status: Never Smoker  . Smokeless tobacco: Never Used  Substance and Sexual Activity  . Alcohol use: No  . Drug use: No  . Sexual activity: Yes    Birth control/protection: Surgical     Family History  Problem Relation Age of Onset  . CAD Mother   . Breast cancer Mother   . Endometrial cancer Mother   . CAD Father   . Emphysema Father       There is no immunization history on file for this patient.   Review of systems: Positive Findings in bold print.  Constitutional:  chills, fatigue, fever, sweats, weight change Communication: Nurse, learning disabilitytranslator, sign Presenter, broadcastinglanguage translator, hand writing, iPad/Android device Head: headaches, head injury Eyes: changes in vision, eye pain, glaucoma, cataracts, macular degeneration, diplopia, glare,  light sensitivity, eyeglasses or contacts, blindness Ears nose mouth throat: hearing impaired, hearing aids,  ringing in ears, deaf, sign language,  vertigo,   nosebleeds,  rhinitis,  cold sores, snoring, swollen glands Cardiovascular: HTN, edema, arrhythmia, pacemaker in place, defibrillator in place,  chest pain/tightness, chronic anticoagulation, blood clot, heart failure, MI Peripheral Vascular: leg cramps, varicose veins, blood clots, lymphedema, varicosities Respiratory:  difficulty breathing, denies congestion, SOB, wheezing, cough, emphysema Gastrointestinal: change in appetite or weight, abdominal pain, constipation, diarrhea, nausea, vomiting, vomiting blood, change in bowel habits, abdominal pain, jaundice, rectal bleeding, hemorrhoids, GERD Genitourinary:  nocturia,  pain on  urination, polyuria,  blood in urine, Foley catheter, urinary urgency, ESRD on hemodialysis Musculoskeletal: amputation, cramping, stiff joints, painful joints, decreased joint motion, fractures, OA, gout, hemiplegia, paraplegia, uses cane, wheelchair bound, uses walker, uses rollator Skin: +changes in toenails, color change, dryness, itching, mole changes,  rash, wound(s) Neurological: headaches, numbness in feet, paresthesias in feet, burning in feet, fainting,  seizures, change in speech. denies headaches, memory problems/poor historian, cerebral palsy, weakness, paralysis, CVA, TIA Endocrine: diabetes, hypothyroidism, hyperthyroidism,  goiter, dry mouth, flushing, heat intolerance,  cold intolerance,  excessive thirst, denies polyuria,  nocturia Hematological:  easy bleeding, excessive bleeding, easy bruising, enlarged lymph nodes, on long term blood thinner, history of past transusions Allergy/immunological:  hives, eczema, frequent infections, multiple drug allergies, seasonal allergies, transplant recipient, multiple food allergies Psychiatric:  anxiety, depression, mood disorder, suicidal ideations, hallucinations, insomnia  Objective: Vascular Examination: Capillary refill time immediate x 10 digits.  Dorsalis pedis pulses palpable b/l.   Posterior tibial pulses nonpalpable b/l.   No digital hair x 10 digits.  Skin temperature gradient WNL b/l  Dermatological Examination: Skin changes consistent with chronic venous insufficiency b/l LE.  Toenails 1-5 b/l discolored, thick, dystrophic with subungual debris and pain with palpation to nailbeds due to thickness of nails.  No open wounds.  No interdigital macerations.  Musculoskeletal: Muscle strength 5/5 to all LE muscle groups  Neurological: Sensation intact with 10 gram monofilament.  Vibratory sensation intact.  Assessment: 1. Painful onychomycosis toenails 1-5 b/l   Plan: 1. Discussed onychomycosis and treatment  options. 2. Toenails 1-5 b/l were debrided in length and girth without iatrogenic bleeding. 3. Patient to continue soft, supportive shoe gear daily. 4. Patient to report any pedal injuries to medical professional immediately. 5. Follow up 3 months.  6. Patient/POA to call should there be a concern in the interim.

## 2018-09-26 ENCOUNTER — Encounter: Payer: Self-pay | Admitting: Podiatry

## 2018-09-26 ENCOUNTER — Ambulatory Visit (INDEPENDENT_AMBULATORY_CARE_PROVIDER_SITE_OTHER): Payer: Self-pay | Admitting: Podiatry

## 2018-09-26 ENCOUNTER — Other Ambulatory Visit: Payer: Self-pay

## 2018-09-26 VITALS — Temp 97.2°F

## 2018-09-26 DIAGNOSIS — M79674 Pain in right toe(s): Secondary | ICD-10-CM

## 2018-09-26 DIAGNOSIS — M79675 Pain in left toe(s): Secondary | ICD-10-CM

## 2018-09-26 DIAGNOSIS — B351 Tinea unguium: Secondary | ICD-10-CM

## 2018-09-26 NOTE — Patient Instructions (Signed)

## 2018-09-26 NOTE — Progress Notes (Signed)
Subjective:  Doris Miller presents to clinic today with cc of  painful, thick, discolored, elongated toenails 1-5 b/l that become tender and cannot cut because of thickness.  Pain is aggravated when wearing enclosed shoe gear.  Doris Du, MD is her PCP.    Current Outpatient Medications:  .  cephALEXin (KEFLEX) 500 MG capsule, Take 1 capsule (500 mg total) by mouth 4 (four) times daily. (Patient not taking: Reported on 06/22/2018), Disp: 40 capsule, Rfl: 0 .  diphenhydrAMINE (BENADRYL) 25 MG tablet, Take 1 tablet (25 mg total) by mouth every 6 (six) hours. (Patient not taking: Reported on 06/22/2018), Disp: 20 tablet, Rfl: 0 .  escitalopram (LEXAPRO) 10 MG tablet, Take 10 mg by mouth daily., Disp: , Rfl:  .  famotidine (PEPCID) 20 MG tablet, Take 1 tablet (20 mg total) by mouth 2 (two) times daily. (Patient not taking: Reported on 06/22/2018), Disp: 30 tablet, Rfl: 0 .  HYDROcodone-acetaminophen (NORCO/VICODIN) 5-325 MG per tablet, Take 1 tablet by mouth every 4 (four) hours as needed. (Patient not taking: Reported on 06/22/2018), Disp: 20 tablet, Rfl: 0 .  lisinopril-hydrochlorothiazide (ZESTORETIC) 10-12.5 MG tablet, Take 1 tablet by mouth daily., Disp: , Rfl:  .  omeprazole (PRILOSEC) 20 MG capsule, Take 20 mg by mouth daily., Disp: , Rfl:  .  predniSONE (DELTASONE) 50 MG tablet, 1 tablet PO daily (Patient not taking: Reported on 06/22/2018), Disp: 5 tablet, Rfl: 0 .  rOPINIRole (REQUIP) 0.5 MG tablet, Take 0.5 mg by mouth at bedtime., Disp: , Rfl:  .  sulfamethoxazole-trimethoprim (SEPTRA DS) 800-160 MG per tablet, Take 1 tablet by mouth every 12 (twelve) hours. (Patient not taking: Reported on 06/22/2018), Disp: 20 tablet, Rfl: 0   Allergies  Allergen Reactions  . Codeine Shortness Of Breath  . Diclofenac Rash     Objective: Vitals:   09/26/18 1312  Temp: (!) 97.2 F (36.2 C)    Physical Examination:  Vascular Examination: Capillary refill time immediate x 10 digits.  Palpable  DP pulses b/l.  PT pulses nonpalpable b/l.  Digital hair absent b/l.  No edema noted b/l.  Skin temperature gradient WNL b/l.  Dermatological Examination: Skin changes consistent with chronic venous insufficiency b/l LE. She has blistering on the anterior aspect of her left lower leg. No oozing. No frank cellulitis.  No open wounds b/l.  No interdigital macerations noted b/l.  Elongated, thick, discolored brittle toenails with subungual debris and pain on dorsal palpation of nailbeds 1-5 b/l.  Musculoskeletal Examination: Muscle strength 5/5 to all muscle groups b/l.  No pain, crepitus or joint discomfort with active/passive ROM.  Neurological Examination: Sensation intact 5/5 b/l with 10 gram monofilament.  Vibratory sensation intact b/l.  Assessment: Mycotic nail infection with pain 1-5 b/l  Plan: Advised patient to call her PCP regarding left LE edema.  1. Toenails 1-5 b/l were debrided in length and girth without iatrogenic laceration. 2.  Continue soft, supportive shoe gear daily. 3.  Report any pedal injuries to medical professional. 4.  Follow up 3 months. 5.  Patient/POA to call should there be a question/concern in there interim.

## 2018-11-13 ENCOUNTER — Ambulatory Visit (HOSPITAL_COMMUNITY)
Admission: RE | Admit: 2018-11-13 | Discharge: 2018-11-13 | Disposition: A | Discharge status: Home / Self Care | Payer: Medicaid Other | Source: Ambulatory Visit | Attending: Pulmonary Disease | Admitting: Pulmonary Disease

## 2018-11-13 ENCOUNTER — Other Ambulatory Visit (HOSPITAL_COMMUNITY): Payer: Self-pay | Admitting: Pulmonary Disease

## 2018-11-13 ENCOUNTER — Other Ambulatory Visit: Payer: Self-pay

## 2018-11-13 DIAGNOSIS — M25552 Pain in left hip: Secondary | ICD-10-CM | POA: Diagnosis not present

## 2018-11-13 DIAGNOSIS — M545 Low back pain: Secondary | ICD-10-CM | POA: Diagnosis not present

## 2018-12-07 ENCOUNTER — Other Ambulatory Visit: Payer: Self-pay

## 2018-12-07 DIAGNOSIS — Z20822 Contact with and (suspected) exposure to covid-19: Secondary | ICD-10-CM

## 2018-12-09 LAB — NOVEL CORONAVIRUS, NAA: SARS-CoV-2, NAA: DETECTED — AB

## 2018-12-31 ENCOUNTER — Ambulatory Visit: Payer: Medicaid Other | Admitting: Podiatry

## 2019-01-02 ENCOUNTER — Ambulatory Visit: Payer: Medicaid Other | Admitting: Podiatry

## 2019-01-02 ENCOUNTER — Other Ambulatory Visit: Payer: Self-pay

## 2019-01-02 DIAGNOSIS — M79675 Pain in left toe(s): Secondary | ICD-10-CM | POA: Diagnosis not present

## 2019-01-02 DIAGNOSIS — B351 Tinea unguium: Secondary | ICD-10-CM | POA: Diagnosis not present

## 2019-01-02 DIAGNOSIS — M79674 Pain in right toe(s): Secondary | ICD-10-CM | POA: Diagnosis not present

## 2019-01-02 NOTE — Progress Notes (Signed)
Tested positive for COVID on 12/07/2018. Not seen today. Patient will call back when she has a negative COVID test result.

## 2019-02-28 ENCOUNTER — Ambulatory Visit: Payer: Medicaid Other | Admitting: Family Medicine

## 2019-03-08 ENCOUNTER — Encounter: Payer: Self-pay | Admitting: Family Medicine

## 2019-03-08 ENCOUNTER — Encounter (INDEPENDENT_AMBULATORY_CARE_PROVIDER_SITE_OTHER): Payer: Self-pay

## 2019-03-08 ENCOUNTER — Other Ambulatory Visit: Payer: Self-pay

## 2019-03-08 ENCOUNTER — Ambulatory Visit (INDEPENDENT_AMBULATORY_CARE_PROVIDER_SITE_OTHER): Payer: Medicaid Other | Admitting: Family Medicine

## 2019-03-08 DIAGNOSIS — E559 Vitamin D deficiency, unspecified: Secondary | ICD-10-CM

## 2019-03-08 DIAGNOSIS — Z1159 Encounter for screening for other viral diseases: Secondary | ICD-10-CM

## 2019-03-08 DIAGNOSIS — Z114 Encounter for screening for human immunodeficiency virus [HIV]: Secondary | ICD-10-CM

## 2019-03-08 DIAGNOSIS — F320 Major depressive disorder, single episode, mild: Secondary | ICD-10-CM

## 2019-03-08 DIAGNOSIS — Z1211 Encounter for screening for malignant neoplasm of colon: Secondary | ICD-10-CM

## 2019-03-08 DIAGNOSIS — R5383 Other fatigue: Secondary | ICD-10-CM | POA: Diagnosis not present

## 2019-03-08 DIAGNOSIS — Z1231 Encounter for screening mammogram for malignant neoplasm of breast: Secondary | ICD-10-CM | POA: Diagnosis not present

## 2019-03-08 DIAGNOSIS — J9801 Acute bronchospasm: Secondary | ICD-10-CM | POA: Diagnosis not present

## 2019-03-08 MED ORDER — PROMETHAZINE-DM 6.25-15 MG/5ML PO SYRP: 2.5000 mL | ORAL_SOLUTION | Freq: Two times a day (BID) | ORAL | 0 refills | Status: DC | PRN

## 2019-03-08 MED ORDER — ALBUTEROL SULFATE HFA 108 (90 BASE) MCG/ACT IN AERS: 1.0000 | INHALATION_SPRAY | Freq: Four times a day (QID) | RESPIRATORY_TRACT | 1 refills | Status: AC | PRN

## 2019-03-08 MED ORDER — ESCITALOPRAM OXALATE 10 MG PO TABS: 10.0000 mg | ORAL_TABLET | Freq: Every day | ORAL | 1 refills | Status: DC

## 2019-03-08 NOTE — Patient Instructions (Addendum)
Happy New Year! May you have a year filled with hope, love, happiness and laughter.  I appreciate the opportunity to provide you with care for your health and wellness. Today we discussed: established care   Follow up: 4 weeks -phone   Labs fasting in a week  No referrals today  Restart Lexapro- 10mg  for the first week increase to 20mg  and if any better then we will send in 20mg  refills. Cough syrup at night (dont drive) Inhaler as needed for shortness of breath, cough, and wheeze  Please continue to practice social distancing to keep you, your family, and our community safe.  If you must go out, please wear a mask and practice good handwashing.  It was a pleasure to see you and I look forward to continuing to work together on your health and well-being. Please do not hesitate to call the office if you need care or have questions about your care.  Have a wonderful day and week. With Gratitude, , DNP, AGNP-BC

## 2019-03-08 NOTE — Progress Notes (Signed)
Subjective:  Patient ID: Doris Miller, female    DOB: 1960-12-26  Age: 59 y.o. MRN: [id number]  CC:  Chief Complaint  Patient presents with  . New Patient (Initial Visit)    establish care,   . Leg Pain    left leg, hurts from hip to toes,x 3 months, constant pain,standing,walking,putting weight on it makes it worse, nothing relieves it, constant pain, 4/10 everyday      HPI  HPI   Doris Miller is a 59 year old female patient who presents today to establish care.  Was previously a patient of Dr. Juanetta Gosling.  The only thing that she wants to report is that she has left leg pain.  Reports that she has pain that hurts her hips to her toes for the last 3 months.  Constant pain with standing, walking, putting weight on it makes it worse, nothing is relieved that she is taken over-the-counter she reports she is in constant pain usually a 4 out of 10 every day.  Reports that it was much worse after she did some housecleaning and moved around a whole lot.  Additionally she reports she has bronchitis-like cough that she gets every year about this time and requires to have a cough syrup given to help manage it.  She denies having any inhaler.  Denies having any cough fever chills or shortness of breath or recent exposure to illness been sick or ill.  Health maintenance: Needs to have HIV/hepatitis C, declined vaccines, Pap smear, colonoscopy, mammogram needs. Unsure when her last labs were.  Reports she takes all the medications reported today as directed.  Which includes Lexapro, Zestoretic, Prilosec.  Reports that she has been out of her Lexapro for about a week or 2.  Needs refill today.  Reports she knows her weight has gotten worse.  She reports that she weighed about 290 a year or so ago.  Prior to children she weighed 160 pounds.  She knows a lot of it has to do with her not doing a lot of exercise and poor eating habits.  She denies smoking, alcohol, drug use.  Lives with her husband and  her children live close by they have 4 trailer 0.  So everybody sees everybody regularly.  Does have 1 cat and cricket.  Enjoys doing crafting with her daughter-in-law's and puzzle books.  Reports that she does a lot more snacking than just eating meals.  Especially since she is not been able to stand a lot she can take care of her husband more since he has been healed.  She drinks a lot of Dr. Reino Kent does not sugar-free.  Only drinks 1 to 3 cups of water regularly.  She cannot wear seatbelt as she has a smothering feeling but otherwise she tries to take good care of herself.  Today patient denies signs and symptoms of COVID 19 infection including fever, chills, cough, shortness of breath, and headache. Past Medical, Surgical, Social History, Allergies, and Medications have been Reviewed.   Past Medical History:  Diagnosis Date  . Arthritis   . Depression   . GERD (gastroesophageal reflux disease)   . Hypertension   . Lymphedema   . OA (osteoarthritis) of knee right     Current Meds  Medication Sig  . escitalopram (LEXAPRO) 10 MG tablet Take 1 tablet (10 mg total) by mouth daily.  Marland Kitchen lisinopril-hydrochlorothiazide (ZESTORETIC) 10-12.5 MG tablet Take 1 tablet by mouth daily.  Marland Kitchen omeprazole (PRILOSEC) 20 MG capsule Take 20  mg by mouth daily.  . [DISCONTINUED] escitalopram (LEXAPRO) 10 MG tablet Take 10 mg by mouth daily.    ROS:  Review of Systems  Constitutional: Negative.   HENT: Negative.   Eyes: Negative.   Respiratory: Negative.   Cardiovascular: Negative.   Gastrointestinal: Negative.   Genitourinary: Negative.   Musculoskeletal: Negative.   Skin: Negative.   Neurological: Negative.   Endo/Heme/Allergies: Negative.   Psychiatric/Behavioral: Negative.   All other systems reviewed and are negative.    Objective:   Today's Vitals: BP 120/68   Pulse 92   Temp (!) 97.1 F (36.2 C) (Temporal)   Resp 15   Ht 5\' 5"  (1.651 m)   Wt (!) 329 lb 1.9 oz (149.3 kg)   LMP  06/13/2011   SpO2 95%   BMI 54.77 kg/m  Vitals with BMI 03/08/2019 05/07/2017 10/26/2013  Height 5\' 5"  - 5\' 5"   Weight 329 lbs 2 oz - 285 lbs  BMI 54.77 - 47.5  Systolic 120 140 12/26/2013  Diastolic 68 95 91  Pulse 92 100 63     Physical Exam Vitals and nursing note reviewed.  Constitutional:      Appearance: Normal appearance. She is obese.  HENT:     Head: Normocephalic and atraumatic.     Right Ear: External ear normal.     Left Ear: External ear normal.     Nose: Nose normal.     Mouth/Throat:     Pharynx: Oropharynx is clear.  Eyes:     General:        Right eye: No discharge.        Left eye: No discharge.     Conjunctiva/sclera: Conjunctivae normal.  Cardiovascular:     Rate and Rhythm: Normal rate and regular rhythm.     Pulses: Normal pulses.     Heart sounds: Normal heart sounds.  Pulmonary:     Effort: Pulmonary effort is normal.     Breath sounds: Normal breath sounds.  Musculoskeletal:     Cervical back: Normal range of motion and neck supple.  Skin:    General: Skin is warm.  Neurological:     General: No focal deficit present.     Mental Status: She is alert and oriented to person, place, and time.  Psychiatric:        Mood and Affect: Mood normal.        Behavior: Behavior normal.        Thought Content: Thought content normal.        Judgment: Judgment normal.    Depression screen [facility] 2/9 03/08/2019  Decreased Interest 0  Down, Depressed, Hopeless 3  PHQ - 2 Score 3  Altered sleeping 3  Tired, decreased energy 1  Change in appetite 0  Feeling bad or failure about yourself  3  Trouble concentrating 0  Moving slowly or fidgety/restless 0  Suicidal thoughts 0  PHQ-9 Score 10     Assessment   1. Morbid obesity (HCC)   2. Depression, major, single episode, mild (HCC)   3. Encounter for screening mammogram for malignant neoplasm of breast   4. Encounter for screening for malignant neoplasm of colon   5. Vitamin D deficiency   6. Fatigue,  unspecified type   7. Encounter for hepatitis C screening test for low risk patient   8. Encounter for screening for HIV   9. Bronchospasm     Tests ordered Orders Placed This Encounter  Procedures  . MM 3D SCREEN  BREAST BILATERAL  . CBC  . COMPLETE METABOLIC PANEL WITH GFR  . Hemoglobin A1c  . Lipid panel  . HEP C AB W/REFL  . HIV Antibody (routine testing w rflx)  . TSH  . VITAMIN D 25 Hydroxy (Vit-D Deficiency, Fractures)  . Cologuard    Plan: Please see assessment and plan per problem list above.   Meds ordered this encounter  Medications  . escitalopram (LEXAPRO) 10 MG tablet    Sig: Take 1 tablet (10 mg total) by mouth daily.    Dispense:  30 tablet    Refill:  1    Order Specific Question:   Supervising Provider    Answer:   SIMPSON, MARGARET E [[id number]]  . promethazine-dextromethorphan (PROMETHAZINE-DM) 6.25-15 MG/5ML syrup    Sig: Take 2.5 mLs by mouth 2 (two) times daily as needed for cough.    Dispense:  118 mL    Refill:  0    Order Specific Question:   Supervising Provider    Answer:   SIMPSON, MARGARET E [[id number]]  . albuterol (VENTOLIN HFA) 108 (90 Base) MCG/ACT inhaler    Sig: Inhale 1-2 puffs into the lungs every 6 (six) hours as needed for wheezing or shortness of breath.    Dispense:  8 g    Refill:  1    Order Specific Question:   Supervising Provider    Answer:   Fayrene Helper [[id number]]    Patient to follow-up in 4 weeks .  Perlie Mayo, NP

## 2019-03-12 DIAGNOSIS — Z1159 Encounter for screening for other viral diseases: Secondary | ICD-10-CM | POA: Insufficient documentation

## 2019-03-12 DIAGNOSIS — E559 Vitamin D deficiency, unspecified: Secondary | ICD-10-CM | POA: Diagnosis not present

## 2019-03-12 DIAGNOSIS — Z1231 Encounter for screening mammogram for malignant neoplasm of breast: Secondary | ICD-10-CM | POA: Insufficient documentation

## 2019-03-12 DIAGNOSIS — Z114 Encounter for screening for human immunodeficiency virus [HIV]: Secondary | ICD-10-CM

## 2019-03-12 DIAGNOSIS — F320 Major depressive disorder, single episode, mild: Secondary | ICD-10-CM | POA: Insufficient documentation

## 2019-03-12 DIAGNOSIS — J9801 Acute bronchospasm: Secondary | ICD-10-CM

## 2019-03-12 DIAGNOSIS — F321 Major depressive disorder, single episode, moderate: Secondary | ICD-10-CM | POA: Insufficient documentation

## 2019-03-12 DIAGNOSIS — Z1211 Encounter for screening for malignant neoplasm of colon: Secondary | ICD-10-CM

## 2019-03-12 DIAGNOSIS — R5383 Other fatigue: Secondary | ICD-10-CM | POA: Insufficient documentation

## 2019-03-12 HISTORY — DX: Encounter for screening for human immunodeficiency virus (HIV): Z11.4

## 2019-03-12 HISTORY — DX: Encounter for screening mammogram for malignant neoplasm of breast: Z12.31

## 2019-03-12 HISTORY — DX: Encounter for screening for other viral diseases: Z11.59

## 2019-03-12 HISTORY — DX: Acute bronchospasm: J98.01

## 2019-03-12 HISTORY — DX: Encounter for screening for malignant neoplasm of colon: Z12.11

## 2019-03-12 NOTE — Assessment & Plan Note (Signed)
Needs up-to-date mammogram ordered today

## 2019-03-12 NOTE — Assessment & Plan Note (Signed)
Fatigue issues possibly related to vitamin D, depression will be getting several labs for follow-up including a thyroid level 2.

## 2019-03-12 NOTE — Assessment & Plan Note (Signed)
US task force recommendation 

## 2019-03-12 NOTE — Assessment & Plan Note (Signed)
History of vitamin D deficiency.  Will be getting a vitamin D level to see if she needs supplementation as that can impaired depression from being better.  In addition to sleep

## 2019-03-12 NOTE — Assessment & Plan Note (Signed)
Unchanged, Deteriorated, Improved  Doris Miller is educated about the importance of exercise daily to help with weight management. A minumum of 30 minutes daily is recommended. Additionally, importance of healthy food choices  with portion control discussed.  Wt Readings from Last 3 Encounters:  03/08/19 (!) 329 lb 1.9 oz (149.3 kg)  10/26/13 285 lb (129.3 kg)  10/25/13 285 lb (129.3 kg)

## 2019-03-12 NOTE — Assessment & Plan Note (Signed)
PHQ-9 10 restarted Lexapro at 10 mg possible need to go up to 20 mg as she was on 10 mg and reported before she ran out she started noticing that it was not working as well for her will restart at 10 in the wean up to a 20.  Close follow-up in 4 weeks.  Denies having any suicidal or homicidal ideations today in the office.

## 2019-03-12 NOTE — Assessment & Plan Note (Signed)
Does not want to do colonoscopy, Cologuard ordered today

## 2019-03-12 NOTE — Assessment & Plan Note (Signed)
Reports having bronchospasm/bronchitis every winter.  Prescribed with inhaler for shortness of breath and wheezing in addition to cough medicine at night that keeps her up at night.

## 2019-03-13 ENCOUNTER — Other Ambulatory Visit: Payer: Self-pay | Admitting: Family Medicine

## 2019-03-13 ENCOUNTER — Other Ambulatory Visit: Payer: Self-pay

## 2019-03-13 DIAGNOSIS — R7989 Other specified abnormal findings of blood chemistry: Secondary | ICD-10-CM

## 2019-03-13 DIAGNOSIS — E559 Vitamin D deficiency, unspecified: Secondary | ICD-10-CM

## 2019-03-13 DIAGNOSIS — R928 Other abnormal and inconclusive findings on diagnostic imaging of breast: Secondary | ICD-10-CM

## 2019-03-13 LAB — REFLEX TIQ

## 2019-03-13 LAB — CBC
HCT: 42.4 % (ref 35.0–45.0)
Hemoglobin: 13.7 g/dL (ref 11.7–15.5)
MCH: 27.2 pg (ref 27.0–33.0)
MCHC: 32.3 g/dL (ref 32.0–36.0)
MCV: 84.3 fL (ref 80.0–100.0)
MPV: 12 fL (ref 7.5–12.5)
Platelets: 298 10*3/uL (ref 140–400)
RBC: 5.03 10*6/uL (ref 3.80–5.10)
RDW: 14.4 % (ref 11.0–15.0)
WBC: 4.8 10*3/uL (ref 3.8–10.8)

## 2019-03-13 LAB — TSH: TSH: 0.34 mIU/L — ABNORMAL LOW (ref 0.40–4.50)

## 2019-03-13 LAB — COMPLETE METABOLIC PANEL WITH GFR
AG Ratio: 1.3 (calc) (ref 1.0–2.5)
ALT: 11 U/L (ref 6–29)
AST: 12 U/L (ref 10–35)
Albumin: 3.7 g/dL (ref 3.6–5.1)
Alkaline phosphatase (APISO): 66 U/L (ref 37–153)
BUN: 17 mg/dL (ref 7–25)
CO2: 23 mmol/L (ref 20–32)
Calcium: 9.2 mg/dL (ref 8.6–10.4)
Chloride: 104 mmol/L (ref 98–110)
Creat: 0.95 mg/dL (ref 0.50–1.05)
GFR, Est African American: 78 mL/min/{1.73_m2} (ref >=60)
GFR, Est Non African American: 67 mL/min/{1.73_m2} (ref >=60)
Globulin: 2.8 g/dL (calc) (ref 1.9–3.7)
Glucose, Bld: 99 mg/dL (ref 65–99)
Potassium: 3.9 mmol/L (ref 3.5–5.3)
Sodium: 139 mmol/L (ref 135–146)
Total Bilirubin: 0.5 mg/dL (ref 0.2–1.2)
Total Protein: 6.5 g/dL (ref 6.1–8.1)

## 2019-03-13 LAB — HEMOGLOBIN A1C
Hgb A1c MFr Bld: 5.5 % of total Hgb (ref <5.7)
Mean Plasma Glucose: 111 (calc)
eAG (mmol/L): 6.2 (calc)

## 2019-03-13 LAB — LIPID PANEL
Cholesterol: 176 mg/dL (ref <200)
HDL: 66 mg/dL (ref >=50)
LDL Cholesterol (Calc): 88 mg/dL (calc)
Non-HDL Cholesterol (Calc): 110 mg/dL (calc) (ref <130)
Total CHOL/HDL Ratio: 2.7 (calc) (ref <5.0)
Triglycerides: 122 mg/dL (ref <150)

## 2019-03-13 LAB — VITAMIN D 25 HYDROXY (VIT D DEFICIENCY, FRACTURES): Vit D, 25-Hydroxy: 14 ng/mL — ABNORMAL LOW (ref 30–100)

## 2019-03-13 LAB — HEP C AB W/REFL
HEPATITIS C ANTIBODY REFILL$(REFL): NONREACTIVE
SIGNAL TO CUT-OFF: 0.02 (ref <1.00)

## 2019-03-13 LAB — HIV ANTIBODY (ROUTINE TESTING W REFLEX): HIV 1&2 Ab, 4th Generation: NONREACTIVE

## 2019-03-13 MED ORDER — VITAMIN D (ERGOCALCIFEROL) 1.25 MG (50000 UNIT) PO CAPS: 50000.0000 [IU] | ORAL_CAPSULE | ORAL | 1 refills | Status: DC

## 2019-03-20 ENCOUNTER — Other Ambulatory Visit (HOSPITAL_COMMUNITY): Payer: Self-pay | Admitting: Family Medicine

## 2019-03-20 DIAGNOSIS — R928 Other abnormal and inconclusive findings on diagnostic imaging of breast: Secondary | ICD-10-CM

## 2019-03-20 DIAGNOSIS — R7989 Other specified abnormal findings of blood chemistry: Secondary | ICD-10-CM | POA: Diagnosis not present

## 2019-03-20 LAB — T3, FREE: T3, Free: 3.3 pg/mL (ref 2.3–4.2)

## 2019-03-20 LAB — T4, FREE: Free T4: 1.2 ng/dL (ref 0.8–1.8)

## 2019-03-20 LAB — TSH: TSH: 0.57 mIU/L (ref 0.40–4.50)

## 2019-04-02 ENCOUNTER — Ambulatory Visit (HOSPITAL_COMMUNITY): Admission: RE | Admit: 2019-04-02 | Payer: Medicaid Other | Source: Ambulatory Visit

## 2019-04-02 ENCOUNTER — Ambulatory Visit (HOSPITAL_COMMUNITY)
Admission: RE | Admit: 2019-04-02 | Discharge: 2019-04-02 | Disposition: A | Discharge status: Home / Self Care | Payer: Medicaid Other | Source: Ambulatory Visit | Attending: Family Medicine | Admitting: Family Medicine

## 2019-04-02 ENCOUNTER — Other Ambulatory Visit: Payer: Self-pay

## 2019-04-02 DIAGNOSIS — R928 Other abnormal and inconclusive findings on diagnostic imaging of breast: Secondary | ICD-10-CM | POA: Diagnosis not present

## 2019-04-04 ENCOUNTER — Ambulatory Visit (INDEPENDENT_AMBULATORY_CARE_PROVIDER_SITE_OTHER): Payer: Medicaid Other | Admitting: Family Medicine

## 2019-04-04 ENCOUNTER — Encounter: Payer: Self-pay | Admitting: Family Medicine

## 2019-04-04 ENCOUNTER — Other Ambulatory Visit: Payer: Self-pay

## 2019-04-04 DIAGNOSIS — K219 Gastro-esophageal reflux disease without esophagitis: Secondary | ICD-10-CM | POA: Diagnosis not present

## 2019-04-04 DIAGNOSIS — F320 Major depressive disorder, single episode, mild: Secondary | ICD-10-CM

## 2019-04-04 MED ORDER — OMEPRAZOLE 20 MG PO CPDR: 20.0000 mg | DELAYED_RELEASE_CAPSULE | Freq: Every day | ORAL | 1 refills | Status: DC

## 2019-04-04 MED ORDER — ESCITALOPRAM OXALATE 20 MG PO TABS: 20.0000 mg | ORAL_TABLET | Freq: Every day | ORAL | 1 refills | Status: DC

## 2019-04-04 NOTE — Assessment & Plan Note (Signed)
Improved PHQ 0 down from 10.  Denies having any SI or HI.  Lexapro 20 mg we will continue this for now.  Follow-up in 3 to 4 months or as needed.

## 2019-04-04 NOTE — Patient Instructions (Addendum)
Happy New Year! May you have a year filled with hope, love, happiness and laughter.  I appreciate the opportunity to provide you with care for your health and wellness. Today we discussed: mood   Follow up: 3-4 months for annual appt    No labs or referrals today  We will order labs day of annual appt, if you come in the morning fasting you can get them that day if it is easier.  Continue lexapro, so glad you are feeling better and more like yourself.  Please continue to practice social distancing to keep you, your family, and our community safe.  If you must go out, please wear a mask and practice good handwashing.  It was a pleasure to see you and I look forward to continuing to work together on your health and well-being. Please do not hesitate to call the office if you need care or have questions about your care.  Have a wonderful day and week. With Gratitude, Tereasa Coop, DNP, AGNP-BC

## 2019-04-04 NOTE — Progress Notes (Signed)
Virtual Visit via Telephone Note   This visit type was conducted due to national recommendations for restrictions regarding the COVID-19 Pandemic (e.g. social distancing) in an effort to limit this patient's exposure and mitigate transmission in our community.  Due to her co-morbid illnesses, this patient is at least at moderate risk for complications without adequate follow up.  This format is felt to be most appropriate for this patient at this time.  The patient did not have access to video technology/had technical difficulties with video requiring transitioning to audio format only (telephone).  All issues noted in this document were discussed and addressed.  No physical exam could be performed with this format.    Evaluation Performed:  Follow-up visit  Date:  04/04/2019   ID:  Doris Miller, DOB 1960/12/27, MRN [id number]  Patient Location: Home Provider Location: Office  Location of Patient: Home Location of Provider: Telehealth Consent was obtain for visit to be over via telehealth. I verified that I am speaking with the correct person using two identifiers.  PCP:  Perlie Mayo, NP   Chief Complaint: Depression  History of Present Illness:    Doris Miller is a 59 y.o. female with history of arthritis, depression, GERD, hypertension, lymphedema among others.  Presents today for follow-up after restarting her Lexapro and increasing it to 20 mg.  Reports that she is doing much better feels like she has energy is sleeping better and overall is in a better mood she denies having any suicidal or homicidal ideations.  Reports that she would like to continue to stay on the 20 mg of Lexapro at this time.  She additionally would like to have a refill of her acid reflux medicine.  She denies having any chest pain, shortness of breath, exposure to Covid.  She denies having any headaches, vision changes, dizziness.  She denies having any heartburn as long as she is on her Prilosec.   She reports that she is trying to eat better and hopefully that will help with some of her acid reflux.  She denies having any changes in her bowel or bladder habits.  The patient does not have symptoms concerning for COVID-19 infection (fever, chills, cough, or new shortness of breath).   Past Medical, Surgical, Social History, Allergies, and Medications have been Reviewed.  Past Medical History:  Diagnosis Date  . Arthritis   . Depression   . GERD (gastroesophageal reflux disease)   . Hypertension   . Lymphedema   . OA (osteoarthritis) of knee right    Past Surgical History:  Procedure Laterality Date  . CHOLECYSTECTOMY    . cyst removal left wrist Left   . TUBAL LIGATION       No outpatient medications have been marked as taking for the 04/04/19 encounter (Appointment) with Perlie Mayo, NP.     Allergies:   Codeine and Diclofenac   ROS:   Please see the history of present illness.    All other systems reviewed and are negative.   Labs/Other Tests and Data Reviewed:    Recent Labs: 03/12/2019: ALT 11; BUN 17; Creat 0.95; Hemoglobin 13.7; Platelets 298; Potassium 3.9; Sodium 139 03/20/2019: TSH 0.57   Recent Lipid Panel Lab Results  Component Value Date/Time   CHOL 176 03/12/2019 11:13 AM   TRIG 122 03/12/2019 11:13 AM   HDL 66 03/12/2019 11:13 AM   CHOLHDL 2.7 03/12/2019 11:13 AM   LDLCALC 88 03/12/2019 11:13 AM    Wt  Readings from Last 3 Encounters:  03/08/19 (!) 329 lb 1.9 oz (149.3 kg)  10/26/13 285 lb (129.3 kg)  10/25/13 285 lb (129.3 kg)     Objective:    Vital Signs:  LMP 06/13/2011    GEN:  Alert and oriented RESPIRATORY:  No shortness of breath noted in conversation PSYCH:  Normal affect and mood good communication  ASSESSMENT & PLAN:    1. Depression, major, single episode, mild (HCC)  - escitalopram (LEXAPRO) 20 MG tablet; Take 1 tablet (20 mg total) by mouth daily.  Dispense: 90 tablet; Refill: 1  2. Gastroesophageal reflux disease,  unspecified whether esophagitis present  - omeprazole (PRILOSEC) 20 MG capsule; Take 1 capsule (20 mg total) by mouth daily.  Dispense: 90 capsule; Refill: 1   Time:   Today, I have spent 20 minutes with the patient with telehealth technology discussing the above problems.     Medication Adjustments/Labs and Tests Ordered: Current medicines are reviewed at length with the patient today.  Concerns regarding medicines are outlined above.   Tests Ordered: No orders of the defined types were placed in this encounter.   Medication Changes: No orders of the defined types were placed in this encounter.   Disposition:  Follow up 4-5 months for annual Signed, Freddy Finner, NP  04/04/2019 2:29 PM     [facility]

## 2019-04-04 NOTE — Assessment & Plan Note (Signed)
Needs refill of Prilosec.  Educated on proper diet for containment of acid reflux. Patient acknowledged agreement and understanding of the plan.

## 2019-04-05 ENCOUNTER — Ambulatory Visit: Payer: Medicaid Other | Admitting: Family Medicine

## 2019-07-03 ENCOUNTER — Other Ambulatory Visit: Payer: Self-pay

## 2019-07-03 ENCOUNTER — Ambulatory Visit (INDEPENDENT_AMBULATORY_CARE_PROVIDER_SITE_OTHER): Payer: Medicaid Other | Admitting: Family Medicine

## 2019-07-03 ENCOUNTER — Encounter: Payer: Self-pay | Admitting: Family Medicine

## 2019-07-03 VITALS — BP 136/84 | HR 87 | Temp 98.8°F | Ht 65.0 in | Wt 330.0 lb

## 2019-07-03 DIAGNOSIS — Z0000 Encounter for general adult medical examination without abnormal findings: Secondary | ICD-10-CM

## 2019-07-03 DIAGNOSIS — K219 Gastro-esophageal reflux disease without esophagitis: Secondary | ICD-10-CM

## 2019-07-03 DIAGNOSIS — F411 Generalized anxiety disorder: Secondary | ICD-10-CM | POA: Diagnosis not present

## 2019-07-03 DIAGNOSIS — F332 Major depressive disorder, recurrent severe without psychotic features: Secondary | ICD-10-CM | POA: Diagnosis not present

## 2019-07-03 MED ORDER — TRAZODONE HCL 50 MG PO TABS: 50.0000 mg | ORAL_TABLET | Freq: Every day | ORAL | 1 refills | Status: DC

## 2019-07-03 MED ORDER — BUSPIRONE HCL 5 MG PO TABS: 5.0000 mg | ORAL_TABLET | Freq: Three times a day (TID) | ORAL | 1 refills | Status: DC

## 2019-07-03 NOTE — Patient Instructions (Addendum)
I appreciate the opportunity to provide you with care for your health and wellness. Today we discussed: overall care   Follow up: 4 weeks and 8 weeks both for depression  No labs or referrals today  Start new medications as discussed.  Please continue to practice social distancing to keep you, your family, and our community safe.  If you must go out, please wear a mask and practice good handwashing.  It was a pleasure to see you and I look forward to continuing to work together on your health and well-being. Please do not hesitate to call the office if you need care or have questions about your care.  Have a wonderful day and week. With Gratitude, Tereasa Coop, DNP, AGNP-BC

## 2019-07-03 NOTE — Assessment & Plan Note (Signed)
Doris Miller is re-educated about the importance of exercise daily to help with weight management. A minumum of 30 minutes daily is recommended. Additionally, importance of healthy food choices  with portion control discussed.   Wt Readings from Last 3 Encounters:  07/03/19 (!) 330 lb (149.7 kg)  03/08/19 (!) 329 lb 1.9 oz (149.3 kg)  10/26/13 285 lb (129.3 kg)

## 2019-07-03 NOTE — Progress Notes (Signed)
Health Maintenance reviewed -  There is no immunization history on file for this patient. Last Pap smear: needs referral -GYN Last mammogram: 03/2019 Last colonoscopy: cologuard  Last DEXA: n/a Dentist: years, brush and floss, some loss Ophtho: eye dr was 3-4 years  Exercise: none structured   Other doctors caring for patient include:  Patient Care Team: Freddy Finner, NP as PCP - General (Family Medicine)  End of Life Discussion:  Patient does not have a living will and medical power of attorney    Code Status: Not on file   Subjective:   HPI  Doris Miller is a 59 y.o. female who presents for annual wellness visit and follow-up on chronic medical conditions.    Has had increasing depression and anxiety. Reports that she is unsure why she is feeling like this but she feels very overwhelmed. She is not sleeping well because of this either. She also reports bilateral knee pain and discomfort.  Review Of Systems  Review of Systems  Constitutional: Negative.   HENT: Negative.   Eyes: Negative.   Respiratory: Negative.   Cardiovascular: Negative.   Gastrointestinal: Negative.   Genitourinary: Negative.   Musculoskeletal:       Knee pain bilateral  Skin: Negative.   Neurological: Positive for headaches.  Psychiatric/Behavioral: Positive for depression. The patient is nervous/anxious and has insomnia.     Objective:   PHYSICAL EXAM:  BP 136/84 (BP Location: Left Wrist, Patient Position: Sitting, Cuff Size: Normal)   Pulse 87   Temp 98.8 F (37.1 C) (Temporal)   Ht 5\' 5"  (1.651 m)   Wt (!) 330 lb (149.7 kg)   LMP 06/13/2011   SpO2 96%   BMI 54.91 kg/m   Physical Exam Vitals and nursing note reviewed.  Constitutional:      Appearance: Normal appearance. She is well-developed and well-groomed. She is morbidly obese.  HENT:     Head: Normocephalic and atraumatic.     Right Ear: Hearing, tympanic membrane, ear canal and external ear normal.    Left Ear: Hearing, tympanic membrane, ear canal and external ear normal.     Nose: Nose normal.     Mouth/Throat:     Lips: Pink.     Mouth: Mucous membranes are moist.     Dentition: Abnormal dentition. Dental caries present.     Pharynx: Oropharynx is clear.  Eyes:     General: Lids are normal.     Extraocular Movements: Extraocular movements intact.     Conjunctiva/sclera: Conjunctivae normal.     Pupils: Pupils are equal, round, and reactive to light.  Neck:     Thyroid: No thyroid mass, thyromegaly or thyroid tenderness.     Vascular: Normal carotid pulses. No carotid bruit.  Cardiovascular:     Rate and Rhythm: Normal rate and regular rhythm.     Pulses: Normal pulses.          Radial pulses are 2+ on the right side and 2+ on the left side.       Dorsalis pedis pulses are 2+ on the right side and 2+ on the left side.     Heart sounds: Normal heart sounds.  Pulmonary:     Effort: Pulmonary effort is normal.     Breath sounds: Examination of the right-lower field reveals decreased breath sounds. Examination of the left-lower field reveals decreased breath sounds. Decreased breath sounds present.  Abdominal:     General: Abdomen is flat.  Bowel sounds are normal.     Palpations: Abdomen is soft.     Tenderness: There is no abdominal tenderness. There is no right CVA tenderness or left CVA tenderness.  Musculoskeletal:        General: Normal range of motion.     Cervical back: Normal range of motion and neck supple.     Right knee: Crepitus present. Tenderness present.     Left knee: Crepitus present. Tenderness present.     Right lower leg: No edema.     Left lower leg: No edema.  Lymphadenopathy:     Cervical: No cervical adenopathy.  Skin:    General: Skin is warm and dry.     Capillary Refill: Capillary refill takes less than 2 seconds.  Neurological:     General: No focal deficit present.     Mental Status: She is alert and oriented to person, place, and time. Mental  status is at baseline.     Cranial Nerves: Cranial nerves are intact.     Sensory: Sensation is intact.     Motor: Motor function is intact.     Coordination: Coordination is intact.     Gait: Gait is intact.     Deep Tendon Reflexes: Reflexes are normal and symmetric.  Psychiatric:        Attention and Perception: Attention and perception normal.        Mood and Affect: Mood is anxious. Affect is tearful.        Speech: Speech normal.        Behavior: Behavior normal. Behavior is cooperative.        Thought Content: Thought content normal.        Cognition and Memory: Cognition and memory normal.        Judgment: Judgment normal.     Depression Screening  Depression screen [facility] 2/9 07/03/2019 04/04/2019 03/08/2019  Decreased Interest 3 0 0  Down, Depressed, Hopeless 3 0 3  PHQ - 2 Score 6 0 3  Altered sleeping 3 0 3  Tired, decreased energy 3 0 1  Change in appetite 3 0 0  Feeling bad or failure about yourself  3 0 3  Trouble concentrating 3 0 0  Moving slowly or fidgety/restless 0 0 0  Suicidal thoughts 1 0 0  PHQ-9 Score 22 0 10  Difficult doing work/chores Extremely dIfficult Not difficult at all -     GAD 7 : Generalized Anxiety Score 07/03/2019  Nervous, Anxious, on Edge 3  Control/stop worrying 3  Worry too much - different things 3  Trouble relaxing 3  Restless 3  Easily annoyed or irritable 3  Afraid - awful might happen 3  Total GAD 7 Score 21  Anxiety Difficulty Extremely difficult      Assessment   1. Severe episode of recurrent major depressive disorder, without psychotic features ([place])   2. Morbid obesity ([place])   3. Gastroesophageal reflux disease without esophagitis   4. GAD (generalized anxiety disorder)     Tests ordered Orders Placed This Encounter  Procedures  . Ambulatory referral to [place]: Please see assessment and plan per problem list above.   Meds ordered this encounter  Medications  . traZODone (DESYREL) 50  MG tablet    Sig: Take 1 tablet (50 mg total) by mouth at bedtime.    Dispense:  30 tablet    Refill:  1    Order Specific Question:   Supervising Provider  Answer:   SIMPSON, MARGARET E [[id number]]  . busPIRone (BUSPAR) 5 MG tablet    Sig: Take 1 tablet (5 mg total) by mouth 3 (three) times daily.    Dispense:  90 tablet    Refill:  1    Order Specific Question:   Supervising Provider    Answer:   Syliva Overman E [[id number]]      Discussed monthly self breast exams and yearly mammograms; at least 30 minutes of aerobic activity at least 5 days/week and weight-bearing exercise 2x/week; proper sunscreen use reviewed; healthy diet, including goals of calcium and vitamin D intake and alcohol recommendations (less than or equal to 1 drink/day) reviewed; regular seatbelt use; changing batteries in smoke detectors.  Immunization recommendations discussed.  Colonoscopy recommendations reviewed.  I have personally reviewed: The patient's medical and social history Their use of alcohol, tobacco or illicit drugs Their current medications and supplements The patient's functional ability including ADLs,fall risks, home safety risks, cognitive, and hearing and visual impairment Diet and physical activities Evidence for depression or mood disorders  The patient's weight, height, BMI, and visual acuity have been recorded in the chart.  I have made referrals, counseling, and provided education to the patient based on review of the above and I have provided the patient with a written personalized care plan for preventive services.     Patient to follow-up in 07/30/2019    Freddy Finner, NP

## 2019-07-03 NOTE — Assessment & Plan Note (Signed)
No S&S continue the prilosec. Refills next month

## 2019-07-05 DIAGNOSIS — F411 Generalized anxiety disorder: Secondary | ICD-10-CM | POA: Insufficient documentation

## 2019-07-05 NOTE — Assessment & Plan Note (Signed)
PHQ is elevated at 22 up from 0 back in February.  Was taking just Lexapro 20 mg this was continued from February.  She denies having any SI or HI.  But is extremely down and sad unsure of exactly what is going on if is just generalized stress or anxiety she just reports that she has a lot going on at home.  She is open to adjustment of medications and possible referral to therapy. Referral to behavioral health therapist made today in addition to Continuing Lexapro , starting trazodone as she reports not sleeping, and use of BuSpar 3 times daily.  Close follow-up 1 month.

## 2019-07-05 NOTE — Assessment & Plan Note (Signed)
GAD is elevated at 21.  Was taking Lexapro and not having any issues with depression or anxiety.    She denies having any SI or HI.  But is extremely stress or anxiety she just reports that she has a lot going on at home.  She is open to adjustment of medications and possible referral to therapy.  Referral to behavioral health therapist made today in addition to Continuing Lexapro , starting trazodone as she reports not sleeping, and use of BuSpar 3 times daily.  Close follow-up 1 month.

## 2019-07-30 ENCOUNTER — Encounter: Payer: Self-pay | Admitting: Family Medicine

## 2019-07-30 ENCOUNTER — Telehealth (INDEPENDENT_AMBULATORY_CARE_PROVIDER_SITE_OTHER): Payer: Medicaid Other | Admitting: Family Medicine

## 2019-07-30 VITALS — BP 136/84 | Ht 65.0 in | Wt 330.0 lb

## 2019-07-30 DIAGNOSIS — F411 Generalized anxiety disorder: Secondary | ICD-10-CM | POA: Diagnosis not present

## 2019-07-30 DIAGNOSIS — F321 Major depressive disorder, single episode, moderate: Secondary | ICD-10-CM

## 2019-07-30 NOTE — Progress Notes (Signed)
Virtual Visit via Telephone Note   This visit type was conducted due to national recommendations for restrictions regarding the COVID-19 Pandemic (e.g. social distancing) in an effort to limit this patient's exposure and mitigate transmission in our community.  Due to her co-morbid illnesses, this patient is at least at moderate risk for complications without adequate follow up.  This format is felt to be most appropriate for this patient at this time.  The patient did not have access to video technology/had technical difficulties with video requiring transitioning to audio format only (telephone).  All issues noted in this document were discussed and addressed.  No physical exam could be performed with this format.    Evaluation Performed:  Follow-up visit  Date:  08/01/2019   ID:  Doris Miller, DOB 09/24/1960, MRN [id number]  Patient Location: Home Provider Location: Office  Location of Patient: Home Location of Provider: Telehealth Consent was obtain for visit to be over via telehealth. I verified that I am speaking with the correct person using two identifiers.  PCP:  Perlie Mayo, NP   Chief Complaint:  Depression follow up   History of Present Illness:    Doris Miller is a 59 y.o. female with depression and anxiety.  Currently trying to find the right medication regimen that helps her control her anxiety and depression better.  Previous visit in office she was very overwhelmed tearful was not sleeping well and just overall just could not function.  Today she reports that she is doing much better and is only had 1 or 2 bouts of significant depression over the last month.  She reports taking her medication as directed and without any issue or concern.  However she does report that she feels like she is sleeping more she was not sure if it was related to the medications or not so she wanted to mention it.  She denies having any chest pain, shortness of breath, palpitations,  leg swelling, cough, headaches, dizziness or vision changes.  The patient does not have symptoms concerning for COVID-19 infection (fever, chills, cough, or new shortness of breath).   Past Medical, Surgical, Social History, Allergies, and Medications have been Reviewed.  Past Medical History:  Diagnosis Date  . Arthritis   . Depression   . Encounter for screening mammogram for malignant neoplasm of breast 03/12/2019  . GERD (gastroesophageal reflux disease)   . Hypertension   . Lymphedema   . OA (osteoarthritis) of knee right    Past Surgical History:  Procedure Laterality Date  . CHOLECYSTECTOMY    . cyst removal left wrist Left   . TUBAL LIGATION       Current Meds  Medication Sig  . albuterol (VENTOLIN HFA) 108 (90 Base) MCG/ACT inhaler Inhale 1-2 puffs into the lungs every 6 (six) hours as needed for wheezing or shortness of breath.  . busPIRone (BUSPAR) 5 MG tablet Take 1 tablet (5 mg total) by mouth 3 (three) times daily.  Marland Kitchen escitalopram (LEXAPRO) 20 MG tablet Take 1 tablet (20 mg total) by mouth daily.  Marland Kitchen lisinopril-hydrochlorothiazide (ZESTORETIC) 10-12.5 MG tablet Take 1 tablet by mouth daily.  Marland Kitchen omeprazole (PRILOSEC) 20 MG capsule Take 1 capsule (20 mg total) by mouth daily.  . promethazine-dextromethorphan (PROMETHAZINE-DM) 6.25-15 MG/5ML syrup Take 2.5 mLs by mouth 2 (two) times daily as needed for cough.  . traZODone (DESYREL) 50 MG tablet Take 1 tablet (50 mg total) by mouth at bedtime.  . Vitamin D, Ergocalciferol, (  DRISDOL) 1.25 MG (50000 UNIT) CAPS capsule Take 1 capsule (50,000 Units total) by mouth every 7 (seven) days.     Allergies:   Codeine and Diclofenac   ROS:   Please see the history of present illness.    All other systems reviewed and are negative.   Labs/Other Tests and Data Reviewed:    Recent Labs: 03/12/2019: ALT 11; BUN 17; Creat 0.95; Hemoglobin 13.7; Platelets 298; Potassium 3.9; Sodium 139 03/20/2019: TSH 0.57   Recent Lipid Panel Lab  Results  Component Value Date/Time   CHOL 176 03/12/2019 11:13 AM   TRIG 122 03/12/2019 11:13 AM   HDL 66 03/12/2019 11:13 AM   CHOLHDL 2.7 03/12/2019 11:13 AM   LDLCALC 88 03/12/2019 11:13 AM    Wt Readings from Last 3 Encounters:  07/30/19 (!) 330 lb (149.7 kg)  07/03/19 (!) 330 lb (149.7 kg)  03/08/19 (!) 329 lb 1.9 oz (149.3 kg)     Objective:    Vital Signs:  BP 136/84   Ht 5\' 5"  (1.651 m)   Wt (!) 330 lb (149.7 kg)   LMP 06/13/2011   BMI 54.91 kg/m    VITAL SIGNS:  reviewed GEN:  alert and oriented  RESPIRATORY:  no shortness of breath in conversation  PSYCH:  normal affect and mood    Depression screen [facility] 2/9 07/30/2019 07/03/2019 04/04/2019 03/08/2019  Decreased Interest 0 3 0 0  Down, Depressed, Hopeless 1 3 0 3  PHQ - 2 Score 1 6 0 3  Altered sleeping 3 3 0 3  Tired, decreased energy 3 3 0 1  Change in appetite 3 3 0 0  Feeling bad or failure about yourself  3 3 0 3  Trouble concentrating 3 3 0 0  Moving slowly or fidgety/restless 3 0 0 0  Suicidal thoughts 1 1 0 0  PHQ-9 Score 20 22 0 10  Difficult doing work/chores Somewhat difficult Extremely dIfficult Not difficult at all -   GAD 7 : Generalized Anxiety Score 07/30/2019 07/03/2019  Nervous, Anxious, on Edge 0 3  Control/stop worrying 0 3  Worry too much - different things 3 3  Trouble relaxing 3 3  Restless 1 3  Easily annoyed or irritable 1 3  Afraid - awful might happen 0 3  Total GAD 7 Score 8 21  Anxiety Difficulty Somewhat difficult Extremely difficult   ASSESSMENT & PLAN:    1. Depression, major, single episode, moderate (HCC)  2. GAD (generalized anxiety disorder)   Time:   Today, I have spent 10 minutes with the patient with telehealth technology discussing the above problems.     Medication Adjustments/Labs and Tests Ordered: Current medicines are reviewed at length with the patient today.  Concerns regarding medicines are outlined above.   Tests Ordered: No orders of the defined  types were placed in this encounter.   Medication Changes: No orders of the defined types were placed in this encounter.   Disposition:  Follow up 8-10 weeks in office  Signed, 07/05/2019, NP  08/01/2019 3:29 PM     08/03/2019 [facility]

## 2019-08-01 ENCOUNTER — Encounter: Payer: Self-pay | Admitting: Family Medicine

## 2019-08-01 NOTE — Patient Instructions (Signed)
I appreciate the opportunity to provide you with care for your health and wellness. Today we discussed: mood Follow up: 8-10 weeks  No labs or referrals today  I hope you continue to feel better as the time goes one.  Please continue to practice social distancing to keep you, your family, and our community safe.  If you must go out, please wear a mask and practice good handwashing.  It was a pleasure to see you and I look forward to continuing to work together on your health and well-being. Please do not hesitate to call the office if you need care or have questions about your care.  Have a wonderful day and week. With Gratitude, Tereasa Coop, DNP, AGNP-BC

## 2019-08-01 NOTE — Assessment & Plan Note (Signed)
Denies having any suicidal plans sometimes she has thoughts but nothing in the last 2 weeks.  Overall she reports that she has started to feel better.  She still has some anxiety but that is much improved more so than even her depression.  Continue current medications at this time.  Close follow-up in office

## 2019-08-01 NOTE — Assessment & Plan Note (Signed)
GAD improved to 8.  Continue taking Lexapro.  Starting trazodone for helping of sleep.  Use of BuSpar continue.  Follow-up in office in 8 to 10 weeks.

## 2019-08-28 ENCOUNTER — Ambulatory Visit: Payer: Medicaid Other | Admitting: Family Medicine

## 2019-09-02 ENCOUNTER — Other Ambulatory Visit: Payer: Self-pay | Admitting: *Deleted

## 2019-09-02 ENCOUNTER — Telehealth: Payer: Self-pay | Admitting: Family Medicine

## 2019-09-02 DIAGNOSIS — K219 Gastro-esophageal reflux disease without esophagitis: Secondary | ICD-10-CM

## 2019-09-02 DIAGNOSIS — F320 Major depressive disorder, single episode, mild: Secondary | ICD-10-CM

## 2019-09-02 MED ORDER — OMEPRAZOLE 20 MG PO CPDR: 20.0000 mg | DELAYED_RELEASE_CAPSULE | Freq: Every day | ORAL | 1 refills | Status: DC

## 2019-09-02 MED ORDER — ESCITALOPRAM OXALATE 20 MG PO TABS: 20.0000 mg | ORAL_TABLET | Freq: Every day | ORAL | 1 refills | Status: DC

## 2019-09-02 MED ORDER — LISINOPRIL-HYDROCHLOROTHIAZIDE 10-12.5 MG PO TABS: 1.0000 | ORAL_TABLET | Freq: Every day | ORAL | 0 refills | Status: DC

## 2019-09-02 NOTE — Telephone Encounter (Signed)
Omeprazole and lexapro sent in for pt pt notified

## 2019-09-02 NOTE — Telephone Encounter (Signed)
Pt needing refill on Acid reflux medication BP medication and depression medication

## 2019-09-03 ENCOUNTER — Telehealth: Payer: Self-pay | Admitting: Family Medicine

## 2019-09-03 ENCOUNTER — Other Ambulatory Visit: Payer: Self-pay | Admitting: *Deleted

## 2019-09-03 DIAGNOSIS — E559 Vitamin D deficiency, unspecified: Secondary | ICD-10-CM

## 2019-09-03 MED ORDER — VITAMIN D (ERGOCALCIFEROL) 1.25 MG (50000 UNIT) PO CAPS: 50000.0000 [IU] | ORAL_CAPSULE | ORAL | 1 refills | Status: DC

## 2019-09-03 NOTE — Telephone Encounter (Signed)
Pt states pharmacy needs approval of medication escitalopram. Pharmacy is [facility] in [place]. Can call pt back at [phone]

## 2019-09-04 NOTE — Telephone Encounter (Signed)
Pt got [facility] to file this under medicaid they were not filing her insurance she is supposed to pick this  up today

## 2019-09-12 ENCOUNTER — Other Ambulatory Visit: Payer: Self-pay

## 2019-09-12 ENCOUNTER — Ambulatory Visit (INDEPENDENT_AMBULATORY_CARE_PROVIDER_SITE_OTHER): Payer: Medicaid Other | Admitting: Family Medicine

## 2019-09-12 ENCOUNTER — Encounter: Payer: Self-pay | Admitting: Family Medicine

## 2019-09-12 VITALS — BP 141/89 | HR 71 | Temp 97.4°F | Resp 18 | Ht 65.0 in | Wt 337.0 lb

## 2019-09-12 DIAGNOSIS — M5432 Sciatica, left side: Secondary | ICD-10-CM | POA: Diagnosis not present

## 2019-09-12 DIAGNOSIS — F411 Generalized anxiety disorder: Secondary | ICD-10-CM | POA: Diagnosis not present

## 2019-09-12 DIAGNOSIS — F320 Major depressive disorder, single episode, mild: Secondary | ICD-10-CM

## 2019-09-12 MED ORDER — GABAPENTIN 100 MG PO CAPS: 100.0000 mg | ORAL_CAPSULE | Freq: Every day | ORAL | 0 refills | Status: DC

## 2019-09-12 NOTE — Progress Notes (Signed)
Subjective:  Patient ID: Doris Miller, female    DOB: January 24, 1961  Age: 59 y.o. MRN: [id number]  CC:  Chief Complaint  Patient presents with  . Follow-up    follow up depression it is better   . Leg Pain    left leg pain from hip down to toes has been going on for awhile but the last little bit has been hurting more so. Its worse at night than day but hurts from hip all the way to toes has tried ibuprofen tylenol anything that will help and nothing is helping pain is at a 4      HPI  HPI   Doris Miller is a 59 year old female patient of mine who presents today for follow-up on depression and anxiety.  She reports she is doing much better with this overall feeling very good back to doing her crafting.  She can With herself that she is feeling better and her family is at that she is feeling better.  The only complaint she has today is that she has developed some left leg pain particularly from the buttocks hip down to the toes that is worse at night.  When she is trying to rest.  Tylenol and ibuprofen have not helped this.  She reports that it kind of feels like a numbness burning and tingling sensation sometimes.  It rotates around her hip down her leg.  Into the top of her toes.  She describes as a 4 out of 10 today.  This has been increasing the last 2 to 3 days.  She denies having any falls, changes in shoes, changes in pillows or mattresses or changes in activity level.  She has dealt with this off and off over the years.  But predominantly just feeling worse with her right now..  Today patient denies signs and symptoms of COVID 19 infection including fever, chills, cough, shortness of breath, and headache. Past Medical, Surgical, Social History, Allergies, and Medications have been Reviewed.   Past Medical History:  Diagnosis Date  . Arthritis   . Depression   . Encounter for screening mammogram for malignant neoplasm of breast 03/12/2019  . GERD (gastroesophageal reflux  disease)   . Hypertension   . Lymphedema   . OA (osteoarthritis) of knee right     Current Meds  Medication Sig  . albuterol (VENTOLIN HFA) 108 (90 Base) MCG/ACT inhaler Inhale 1-2 puffs into the lungs every 6 (six) hours as needed for wheezing or shortness of breath.  . busPIRone (BUSPAR) 5 MG tablet Take 1 tablet (5 mg total) by mouth 3 (three) times daily.  Marland Kitchen escitalopram (LEXAPRO) 20 MG tablet Take 1 tablet (20 mg total) by mouth daily.  Marland Kitchen lisinopril-hydrochlorothiazide (ZESTORETIC) 10-12.5 MG tablet Take 1 tablet by mouth daily.  Marland Kitchen omeprazole (PRILOSEC) 20 MG capsule Take 1 capsule (20 mg total) by mouth daily.  . promethazine-dextromethorphan (PROMETHAZINE-DM) 6.25-15 MG/5ML syrup Take 2.5 mLs by mouth 2 (two) times daily as needed for cough.  . traZODone (DESYREL) 50 MG tablet Take 1 tablet (50 mg total) by mouth at bedtime.  . Vitamin D, Ergocalciferol, (DRISDOL) 1.25 MG (50000 UNIT) CAPS capsule Take 1 capsule (50,000 Units total) by mouth every 7 (seven) days.    ROS:  Review of Systems  Constitutional: Negative.   HENT: Negative.   Eyes: Negative.   Respiratory: Negative.   Cardiovascular: Negative.   Gastrointestinal: Negative.   Genitourinary: Negative.   Musculoskeletal: Negative.  Left leg pain  Skin: Negative.   Neurological: Negative.   Endo/Heme/Allergies: Negative.   Psychiatric/Behavioral: Negative.   All other systems reviewed and are negative.    Objective:   Today's Vitals: BP (!) 141/89 (BP Location: Right Arm, Patient Position: Sitting, Cuff Size: Normal)   Pulse 71   Temp (!) 97.4 F (36.3 C) (Temporal)   Resp 18   Ht 5\' 5"  (1.651 m)   Wt (!) 337 lb (152.9 kg)   LMP 06/13/2011   SpO2 94%   BMI 56.08 kg/m  Vitals with BMI 09/12/2019 07/30/2019 07/03/2019  Height 5\' 5"  5\' 5"  5\' 5"   Weight 337 lbs 330 lbs 330 lbs  BMI 56.08 54.91 54.91  Systolic 141 136 07/05/2019  Diastolic 89 84 84  Pulse 71 - 87     Physical Exam Vitals and nursing  note reviewed.  Constitutional:      Appearance: Normal appearance. She is well-developed and well-groomed. She is morbidly obese.  HENT:     Head: Normocephalic and atraumatic.     Right Ear: External ear normal.     Left Ear: External ear normal.     Mouth/Throat:     Comments: Mask in place  Eyes:     General:        Right eye: No discharge.        Left eye: No discharge.     Conjunctiva/sclera: Conjunctivae normal.  Cardiovascular:     Rate and Rhythm: Normal rate and regular rhythm.     Pulses: Normal pulses.     Heart sounds: Normal heart sounds.  Pulmonary:     Effort: Pulmonary effort is normal.     Breath sounds: Normal breath sounds.  Musculoskeletal:     Cervical back: Normal range of motion and neck supple.     Comments: MAE, ROM intact   Skin:    General: Skin is warm.  Neurological:     General: No focal deficit present.     Mental Status: She is alert and oriented to person, place, and time.  Psychiatric:        Mood and Affect: Mood normal.        Behavior: Behavior normal. Behavior is cooperative.        Thought Content: Thought content normal.        Judgment: Judgment normal.      Depression screen [facility] 2/9 09/12/2019 07/30/2019 07/03/2019 04/04/2019 03/08/2019  Decreased Interest 0 0 3 0 0  Down, Depressed, Hopeless 0 1 3 0 3  PHQ - 2 Score 0 1 6 0 3  Altered sleeping 0 3 3 0 3  Tired, decreased energy 0 3 3 0 1  Change in appetite 0 3 3 0 0  Feeling bad or failure about yourself  0 3 3 0 3  Trouble concentrating 0 3 3 0 0  Moving slowly or fidgety/restless 0 3 0 0 0  Suicidal thoughts 0 1 1 0 0  PHQ-9 Score 0 20 22 0 10  Difficult doing work/chores Not difficult at all Somewhat difficult Extremely dIfficult Not difficult at all -      GAD 7 : Generalized Anxiety Score 09/12/2019 07/30/2019 07/03/2019  Nervous, Anxious, on Edge 0 0 3  Control/stop worrying 0 0 3  Worry too much - different things 0 3 3  Trouble relaxing 0 3 3  Restless 0 1 3    Easily annoyed or irritable 0 1 3  Afraid - awful might happen 0  0 3  Total GAD 7 Score 0 8 21  Anxiety Difficulty Not difficult at all Somewhat difficult Extremely difficult        Assessment   1. Depression, major, single episode, mild (HCC)   2. GAD (generalized anxiety disorder)   3. Sciatica of left side     Tests ordered No orders of the defined types were placed in this encounter.    Plan: Please see assessment and plan per problem list above.   Meds ordered this encounter  Medications  . gabapentin (NEURONTIN) 100 MG capsule    Sig: Take 1 capsule (100 mg total) by mouth at bedtime.    Dispense:  15 capsule    Refill:  0    Order Specific Question:   Supervising Provider    Answer:   Kerri Perches [[id number]]    Patient to follow-up in 3 months   Freddy Finner, NP

## 2019-09-12 NOTE — Assessment & Plan Note (Signed)
Much improved, continue BuSpar and Lexapro at this time.  Denies have any SI or HI at this time.

## 2019-09-12 NOTE — Assessment & Plan Note (Signed)
Continue GAD improvement.  Continue Lexapro and BuSpar.  Start trazodone for sleep.

## 2019-09-12 NOTE — Patient Instructions (Addendum)
I appreciate the opportunity to provide you with care for your health and wellness. Today we discussed: mood and leg pain   Follow up: 4 weeks by phone  No labs or referrals today  Work on sciatica exercises I have included below.  Do these for the next 4 weeks.  Take the gabapentin right before bed.  Please remember that it can make you dizzy and lightheaded.  To be careful of falls.  Please do not operate heavy machinery when taking this medication.  You will take this nightly for 2 weeks.  Please continue to practice social distancing to keep you, your family, and our community safe.  If you must go out, please wear a mask and practice good handwashing.  It was a pleasure to see you and I look forward to continuing to work together on your health and well-being. Please do not hesitate to call the office if you need care or have questions about your care.  Have a wonderful day and week. With Gratitude, Tereasa Coop, DNP, AGNP-BC   Sciatica  Sciatica is pain, weakness, tingling, or loss of feeling (numbness) along the sciatic nerve. The sciatic nerve starts in the lower back and goes down the back of each leg. Sciatica usually goes away on its own or with treatment. Sometimes, sciatica may come back (recur). What are the causes? This condition happens when the sciatic nerve is pinched or has pressure put on it. This may be the result of:  A disk in between the bones of the spine bulging out too far (herniated disk).  Changes in the spinal disks that occur with aging.  A condition that affects a muscle in the butt.  Extra bone growth near the sciatic nerve.  A break (fracture) of the area between your hip bones (pelvis).  Pregnancy.  Tumor. This is rare. What increases the risk? You are more likely to develop this condition if you:  Play sports that put pressure or stress on the spine.  Have poor strength and ease of movement (flexibility).  Have had a back injury in  the past.  Have had back surgery.  Sit for long periods of time.  Do activities that involve bending or lifting over and over again.  Are very overweight (obese). What are the signs or symptoms? Symptoms can vary from mild to very bad. They may include:  Any of these problems in the lower back, leg, hip, or butt: ? Mild tingling, loss of feeling, or dull aches. ? Burning sensations. ? Sharp pains.  Loss of feeling in the back of the calf or the sole of the foot.  Leg weakness.  Very bad back pain that makes it hard to move. These symptoms may get worse when you cough, sneeze, or laugh. They may also get worse when you sit or stand for long periods of time. How is this treated? This condition often gets better without any treatment. However, treatment may include:  Changing or cutting back on physical activity when you have pain.  Doing exercises and stretching.  Putting ice or heat on the affected area.  Medicines that help: ? To relieve pain and swelling. ? To relax your muscles.  Shots (injections) of medicines that help to relieve pain, irritation, and swelling.  Surgery. Follow these instructions at home: Medicines  Take over-the-counter and prescription medicines only as told by your doctor.  Ask your doctor if the medicine prescribed to you: ? Requires you to avoid driving or using heavy  machinery. ? Can cause trouble pooping (constipation). You may need to take these steps to prevent or treat trouble pooping:  Drink enough fluids to keep your pee (urine) pale yellow.  Take over-the-counter or prescription medicines.  Eat foods that are high in fiber. These include beans, whole grains, and fresh fruits and vegetables.  Limit foods that are high in fat and sugar. These include fried or sweet foods. Managing pain      If told, put ice on the affected area. ? Put ice in a plastic bag. ? Place a towel between your skin and the bag. ? Leave the ice on  for 20 minutes, 2-3 times a day.  If told, put heat on the affected area. Use the heat source that your doctor tells you to use, such as a moist heat pack or a heating pad. ? Place a towel between your skin and the heat source. ? Leave the heat on for 20-30 minutes. ? Remove the heat if your skin turns bright red. This is very important if you are unable to feel pain, heat, or cold. You may have a greater risk of getting burned. Activity   Return to your normal activities as told by your doctor. Ask your doctor what activities are safe for you.  Avoid activities that make your symptoms worse.  Take short rests during the day. ? When you rest for a long time, do some physical activity or stretching between periods of rest. ? Avoid sitting for a long time without moving. Get up and move around at least one time each hour.  Exercise and stretch regularly, as told by your doctor.  Do not lift anything that is heavier than 10 lb (4.5 kg) while you have symptoms of sciatica. ? Avoid lifting heavy things even when you do not have symptoms. ? Avoid lifting heavy things over and over.  When you lift objects, always lift in a way that is safe for your body. To do this, you should: ? Bend your knees. ? Keep the object close to your body. ? Avoid twisting. General instructions  Stay at a healthy weight.  Wear comfortable shoes that support your feet. Avoid wearing high heels.  Avoid sleeping on a mattress that is too soft or too hard. You might have less pain if you sleep on a mattress that is firm enough to support your back.  Keep all follow-up visits as told by your doctor. This is important. Contact a doctor if:  You have pain that: ? Wakes you up when you are sleeping. ? Gets worse when you lie down. ? Is worse than the pain you have had in the past. ? Lasts longer than 4 weeks.  You lose weight without trying. Get help right away if:  You cannot control when you pee (urinate)  or poop (have a bowel movement).  You have weakness in any of these areas and it gets worse: ? Lower back. ? The area between your hip bones. ? Butt. ? Legs.  You have redness or swelling of your back.  You have a burning feeling when you pee. Summary  Sciatica is pain, weakness, tingling, or loss of feeling (numbness) along the sciatic nerve.  This condition happens when the sciatic nerve is pinched or has pressure put on it.  Sciatica can cause pain, tingling, or loss of feeling (numbness) in the lower back, legs, hips, and butt.  Treatment often includes rest, exercise, medicines, and putting ice or heat on  the affected area. This information is not intended to replace advice given to you by your health care provider. Make sure you discuss any questions you have with your health care provider. Document Revised: 02/19/2018 Document Reviewed: 02/19/2018 Elsevier Patient Education  2020 [employer]    Sciatica Rehab Ask your health care provider which exercises are safe for you. Do exercises exactly as told by your health care provider and adjust them as directed. It is normal to feel mild stretching, pulling, tightness, or discomfort as you do these exercises. Stop right away if you feel sudden pain or your pain gets worse. Do not begin these exercises until told by your health care provider. Stretching and range-of-motion exercises These exercises warm up your muscles and joints and improve the movement and flexibility of your hips and back. These exercises also help to relieve pain, numbness, and tingling. Sciatic nerve glide 1. Sit in a chair with your head facing down toward your chest. Place your hands behind your back. Let your shoulders slump forward. 2. Slowly straighten one of your legs while you tilt your head back as if you are looking toward the ceiling. Only straighten your leg as far as you can without making your symptoms worse. 3. Hold this position for  ____10-20______ seconds. 4. Slowly return to the starting position. 5. Repeat with your other leg. Repeat __2-3________ times. Complete this exercise ____3______ times a day. Knee to chest with hip adduction and internal rotation  1. Lie on your back on a firm surface with both legs straight. 2. Bend one of your knees and move it up toward your chest until you feel a gentle stretch in your lower back and buttock. Then, move your knee toward the shoulder that is on the opposite side from your leg. This is hip adduction and internal rotation. ? Hold your leg in this position by holding on to the front of your knee. 3. Hold this position for ___10-20_______ seconds. 4. Slowly return to the starting position. 5. Repeat with your other leg. Repeat ____2-3______ times. Complete this exercise ______3____ times a day. Prone extension on elbows  1. Lie on your abdomen on a firm surface. A bed may be too soft for this exercise. 2. Prop yourself up on your elbows. 3. Use your arms to help lift your chest up until you feel a gentle stretch in your abdomen and your lower back. ? This will place some of your body weight on your elbows. If this is uncomfortable, try stacking pillows under your chest. ? Your hips should stay down, against the surface that you are lying on. Keep your hip and back muscles relaxed. 4. Hold this position for ___10-20_______ seconds. 5. Slowly relax your upper body and return to the starting position. Repeat ___2-3_______ times. Complete this exercise _3_________ times a day. Strengthening exercises These exercises build strength and endurance in your back. Endurance is the ability to use your muscles for a long time, even after they get tired. Pelvic tilt This exercise strengthens the muscles that lie deep in the abdomen. 1. Lie on your back on a firm surface. Bend your knees and keep your feet flat on the floor. 2. Tense your abdominal muscles. Tip your pelvis up toward the  ceiling and flatten your lower back into the floor. ? To help with this exercise, you may place a small towel under your lower back and try to push your back into the towel. 3. Hold this position for __10-20________ seconds. 4.  Let your muscles relax completely before you repeat this exercise. Repeat _____2-3_____ times. Complete this exercise ______3____ times a day. Alternating arm and leg raises  1. Get on your hands and knees on a firm surface. If you are on a hard floor, you may want to use padding, such as an exercise mat, to cushion your knees. 2. Line up your arms and legs. Your hands should be directly below your shoulders, and your knees should be directly below your hips. 3. Lift your left leg behind you. At the same time, raise your right arm and straighten it in front of you. ? Do not lift your leg higher than your hip. ? Do not lift your arm higher than your shoulder. ? Keep your abdominal and back muscles tight. ? Keep your hips facing the ground. ? Do not arch your back. ? Keep your balance carefully, and do not hold your breath. 4. Hold this position for _10-20________ seconds. 5. Slowly return to the starting position. 6. Repeat with your right leg and your left arm. Repeat ___2-3_______ times. Complete this exercise ____3______ times a day. Posture and body mechanics Good posture and healthy body mechanics can help to relieve stress in your body's tissues and joints. Body mechanics refers to the movements and positions of your body while you do your daily activities. Posture is part of body mechanics. Good posture means:  Your spine is in its natural S-curve position (neutral).  Your shoulders are pulled back slightly.  Your head is not tipped forward. Follow these guidelines to improve your posture and body mechanics in your everyday activities. Standing   When standing, keep your spine neutral and your feet about hip width apart. Keep a slight bend in your knees.  Your ears, shoulders, and hips should line up.  When you do a task in which you stand in one place for a long time, place one foot up on a stable object that is 2-4 inches (5-10 cm) high, such as a footstool. This helps keep your spine neutral. Sitting   When sitting, keep your spine neutral and keep your feet flat on the floor. Use a footrest, if necessary, and keep your thighs parallel to the floor. Avoid rounding your shoulders, and avoid tilting your head forward.  When working at a desk or a computer, keep your desk at a height where your hands are slightly lower than your elbows. Slide your chair under your desk so you are close enough to maintain good posture.  When working at a computer, place your monitor at a height where you are looking straight ahead and you do not have to tilt your head forward or downward to look at the screen. Resting  When lying down and resting, avoid positions that are most painful for you.  If you have pain with activities such as sitting, bending, stooping, or squatting, lie in a position in which your body does not bend very much. For example, avoid curling up on your side with your arms and knees near your chest (fetal position).  If you have pain with activities such as standing for a long time or reaching with your arms, lie with your spine in a neutral position and bend your knees slightly. Try the following positions: ? Lying on your side with a pillow between your knees. ? Lying on your back with a pillow under your knees. Lifting   When lifting objects, keep your feet at least shoulder width apart and tighten your abdominal  muscles.  Bend your knees and hips and keep your spine neutral. It is important to lift using the strength of your legs, not your back. Do not lock your knees straight out.  Always ask for help to lift heavy or awkward objects. This information is not intended to replace advice given to you by your health care provider. Make  sure you discuss any questions you have with your health care provider. Document Revised: 05/25/2018 Document Reviewed: 02/22/2018 Elsevier Patient Education  2020 [employer].

## 2019-09-12 NOTE — Assessment & Plan Note (Addendum)
Exercises provided today.  2-week short course of nightly gabapentin.  Possible need for physical therapy if unable to do the physical therapy exercises at home.  And referral to Ortho if continued.  Provided with education on the side effects of gabapentin.  Taking appropriately.

## 2019-10-09 ENCOUNTER — Other Ambulatory Visit: Payer: Self-pay | Admitting: *Deleted

## 2019-10-09 DIAGNOSIS — F320 Major depressive disorder, single episode, mild: Secondary | ICD-10-CM

## 2019-10-09 MED ORDER — ESCITALOPRAM OXALATE 20 MG PO TABS
20.0000 mg | ORAL_TABLET | Freq: Every day | ORAL | 1 refills | Status: DC
Start: 2019-10-09 — End: 2020-04-01

## 2019-10-15 ENCOUNTER — Encounter: Payer: Self-pay | Admitting: Family Medicine

## 2019-10-15 ENCOUNTER — Telehealth (INDEPENDENT_AMBULATORY_CARE_PROVIDER_SITE_OTHER): Payer: Medicaid Other | Admitting: Family Medicine

## 2019-10-15 ENCOUNTER — Other Ambulatory Visit: Payer: Self-pay

## 2019-10-15 VITALS — BP 141/89 | Ht 65.0 in | Wt 337.0 lb

## 2019-10-15 DIAGNOSIS — F320 Major depressive disorder, single episode, mild: Secondary | ICD-10-CM | POA: Diagnosis not present

## 2019-10-15 DIAGNOSIS — F411 Generalized anxiety disorder: Secondary | ICD-10-CM | POA: Diagnosis not present

## 2019-10-15 NOTE — Patient Instructions (Addendum)
I appreciate the opportunity to provide you with care for your health and wellness. Today we discussed: mood   Follow up: 3 months   No labs or referrals today  Continue current medications as directed.  So glad you ar feeling better overall :)  Please continue to practice social distancing to keep you, your family, and our community safe.  If you must go out, please wear a mask and practice good handwashing.  It was a pleasure to see you and I look forward to continuing to work together on your health and well-being. Please do not hesitate to call the office if you need care or have questions about your care.  Have a wonderful day and week. With Gratitude, Tereasa Coop, DNP, AGNP-BC

## 2019-10-15 NOTE — Assessment & Plan Note (Signed)
Denies having any SI or HI.  Continue BuSpar and Lexapro at this time.

## 2019-10-15 NOTE — Progress Notes (Signed)
Virtual Visit via Telephone Note   This visit type was conducted due to national recommendations for restrictions regarding the COVID-19 Pandemic (e.g. social distancing) in an effort to limit this patient's exposure and mitigate transmission in our community.  Due to her co-morbid illnesses, this patient is at least at moderate risk for complications without adequate follow up.  This format is felt to be most appropriate for this patient at this time.  The patient did not have access to video technology/had technical difficulties with video requiring transitioning to audio format only (telephone).  All issues noted in this document were discussed and addressed.  No physical exam could be performed with this format.   Evaluation Performed:  Follow-up visit  Date:  10/15/2019   ID:  ITA FRITZSCHE, DOB 27-Mar-1960, MRN [id number]  Patient Location: Home Provider Location: Office/Clinic  Location of Patient: Home Location of Provider: Telehealth Consent was obtain for visit to be over via telehealth. I verified that I am speaking with the correct person using two identifiers.  PCP:  Freddy Finner, NP   Chief Complaint: Medication follow-up.  History of Present Illness:    Doris Miller is a 59 y.o. female with history of arthritis, depression, hypertension, lymphedema, obesity, osteoarthritis of right knee.  Presents today for follow-up on anxiety and depression.  Overall she reports that she is feeling well and doing good.  Denies having any issues or concerns.  Has been sleeping better with the trazodone as well.  Both PHQ and GAD scores are down.  She reports that she is sleeping better she has more energy.  She denies having any other issues or concerns to discuss today in the visit.  The patient does not have symptoms concerning for COVID-19 infection (fever, chills, cough, or new shortness of breath).   Past Medical, Surgical, Social History, Allergies, and Medications have  been Reviewed.  Past Medical History:  Diagnosis Date  . Arthritis   . Bronchospasm 03/12/2019  . Depression   . Encounter for hepatitis C screening test for low risk patient 03/12/2019  . Encounter for screening for HIV 03/12/2019  . Encounter for screening for malignant neoplasm of colon 03/12/2019  . Encounter for screening mammogram for malignant neoplasm of breast 03/12/2019  . GERD (gastroesophageal reflux disease)   . Hypertension   . Lymphedema   . OA (osteoarthritis) of knee right    Past Surgical History:  Procedure Laterality Date  . CHOLECYSTECTOMY    . cyst removal left wrist Left   . TUBAL LIGATION       Current Meds  Medication Sig  . albuterol (VENTOLIN HFA) 108 (90 Base) MCG/ACT inhaler Inhale 1-2 puffs into the lungs every 6 (six) hours as needed for wheezing or shortness of breath.  . busPIRone (BUSPAR) 5 MG tablet Take 1 tablet (5 mg total) by mouth 3 (three) times daily.  Marland Kitchen escitalopram (LEXAPRO) 20 MG tablet Take 1 tablet (20 mg total) by mouth daily.  Marland Kitchen gabapentin (NEURONTIN) 100 MG capsule Take 1 capsule (100 mg total) by mouth at bedtime.  Marland Kitchen lisinopril-hydrochlorothiazide (ZESTORETIC) 10-12.5 MG tablet Take 1 tablet by mouth daily.  Marland Kitchen omeprazole (PRILOSEC) 20 MG capsule Take 1 capsule (20 mg total) by mouth daily.  . promethazine-dextromethorphan (PROMETHAZINE-DM) 6.25-15 MG/5ML syrup Take 2.5 mLs by mouth 2 (two) times daily as needed for cough.  . traZODone (DESYREL) 50 MG tablet Take 1 tablet (50 mg total) by mouth at bedtime.  . Vitamin D,  Ergocalciferol, (DRISDOL) 1.25 MG (50000 UNIT) CAPS capsule Take 1 capsule (50,000 Units total) by mouth every 7 (seven) days.     Allergies:   Codeine and Diclofenac   ROS:   Please see the history of present illness.    All other systems reviewed and are negative.   Labs/Other Tests and Data Reviewed:    Recent Labs: 03/12/2019: ALT 11; BUN 17; Creat 0.95; Hemoglobin 13.7; Platelets 298; Potassium 3.9; Sodium  139 03/20/2019: TSH 0.57   Recent Lipid Panel Lab Results  Component Value Date/Time   CHOL 176 03/12/2019 11:13 AM   TRIG 122 03/12/2019 11:13 AM   HDL 66 03/12/2019 11:13 AM   CHOLHDL 2.7 03/12/2019 11:13 AM   LDLCALC 88 03/12/2019 11:13 AM    Wt Readings from Last 3 Encounters:  10/15/19 (!) 337 lb (152.9 kg)  09/12/19 (!) 337 lb (152.9 kg)  07/30/19 (!) 330 lb (149.7 kg)     Objective:    Vital Signs:  BP (!) 141/89   Ht 5\' 5"  (1.651 m)   Wt (!) 337 lb (152.9 kg)   LMP 06/13/2011   BMI 56.08 kg/m    VITAL SIGNS:  reviewed GEN:  alert and oriented RESPIRATORY:  no shortness of breath in conversation PSYCH:  normal affect and mood    Depression screen [facility] 2/9 10/15/2019 09/12/2019 07/30/2019 07/03/2019 04/04/2019  Decreased Interest 0 0 0 3 0  Down, Depressed, Hopeless 0 0 1 3 0  PHQ - 2 Score 0 0 1 6 0  Altered sleeping 2 0 3 3 0  Tired, decreased energy 2 0 3 3 0  Change in appetite 0 0 3 3 0  Feeling bad or failure about yourself  0 0 3 3 0  Trouble concentrating 0 0 3 3 0  Moving slowly or fidgety/restless 0 0 3 0 0  Suicidal thoughts 0 0 1 1 0  PHQ-9 Score 4 0 20 22 0  Difficult doing work/chores Not difficult at all Not difficult at all Somewhat difficult Extremely dIfficult Not difficult at all    GAD 7 : Generalized Anxiety Score 10/15/2019 09/12/2019 07/30/2019 07/03/2019  Nervous, Anxious, on Edge 0 0 0 3  Control/stop worrying 0 0 0 3  Worry too much - different things 0 0 3 3  Trouble relaxing 0 0 3 3  Restless 0 0 1 3  Easily annoyed or irritable 0 0 1 3  Afraid - awful might happen 0 0 0 3  Total GAD 7 Score 0 0 8 21  Anxiety Difficulty Not difficult at all Not difficult at all Somewhat difficult Extremely difficult      ASSESSMENT & PLAN:    1. Depression, major, single episode, mild (HCC)  2. GAD (generalized anxiety disorder)  Time:   Today, I have spent 10 minutes with the patient with telehealth technology discussing the above problems.      Medication Adjustments/Labs and Tests Ordered: Current medicines are reviewed at length with the patient today.  Concerns regarding medicines are outlined above.   Tests Ordered: No orders of the defined types were placed in this encounter.   Medication Changes: No orders of the defined types were placed in this encounter.   Disposition:  Follow up 3 months   Signed, 07/05/2019, NP  10/15/2019 3:09 PM     10/17/2019 [facility]

## 2019-10-15 NOTE — Assessment & Plan Note (Signed)
Continue GAD improvement, continue Lexapro and BuSpar.  Start trazodone for sleep and this has been very helpful.  Overall doing well.  Denies have any SI or HI.

## 2019-12-06 ENCOUNTER — Other Ambulatory Visit: Payer: Self-pay | Admitting: Family Medicine

## 2019-12-10 ENCOUNTER — Other Ambulatory Visit: Payer: Self-pay

## 2019-12-10 MED ORDER — LISINOPRIL-HYDROCHLOROTHIAZIDE 10-12.5 MG PO TABS: 1.0000 | ORAL_TABLET | Freq: Every day | ORAL | 0 refills | Status: DC

## 2020-01-14 ENCOUNTER — Ambulatory Visit: Payer: Medicaid Other | Admitting: Family Medicine

## 2020-02-19 ENCOUNTER — Other Ambulatory Visit: Payer: Self-pay | Admitting: Family Medicine

## 2020-02-19 DIAGNOSIS — E559 Vitamin D deficiency, unspecified: Secondary | ICD-10-CM

## 2020-03-10 ENCOUNTER — Other Ambulatory Visit: Payer: Self-pay | Admitting: Family Medicine

## 2020-03-25 ENCOUNTER — Other Ambulatory Visit: Payer: Self-pay | Admitting: Family Medicine

## 2020-03-25 DIAGNOSIS — K219 Gastro-esophageal reflux disease without esophagitis: Secondary | ICD-10-CM

## 2020-04-01 ENCOUNTER — Other Ambulatory Visit: Payer: Self-pay | Admitting: Family Medicine

## 2020-04-01 DIAGNOSIS — F320 Major depressive disorder, single episode, mild: Secondary | ICD-10-CM

## 2020-05-13 ENCOUNTER — Other Ambulatory Visit: Payer: Self-pay | Admitting: Family Medicine

## 2020-05-13 DIAGNOSIS — E559 Vitamin D deficiency, unspecified: Secondary | ICD-10-CM

## 2020-06-03 ENCOUNTER — Other Ambulatory Visit: Payer: Self-pay | Admitting: Family Medicine

## 2020-06-25 ENCOUNTER — Other Ambulatory Visit: Payer: Self-pay | Admitting: Family Medicine

## 2020-06-25 DIAGNOSIS — K219 Gastro-esophageal reflux disease without esophagitis: Secondary | ICD-10-CM

## 2020-07-01 ENCOUNTER — Other Ambulatory Visit: Payer: Self-pay | Admitting: Family Medicine

## 2020-07-01 DIAGNOSIS — F320 Major depressive disorder, single episode, mild: Secondary | ICD-10-CM

## 2020-08-05 ENCOUNTER — Other Ambulatory Visit: Payer: Self-pay | Admitting: Internal Medicine

## 2020-08-05 DIAGNOSIS — E559 Vitamin D deficiency, unspecified: Secondary | ICD-10-CM

## 2020-08-25 ENCOUNTER — Encounter: Payer: Self-pay | Admitting: Family Medicine

## 2020-08-25 ENCOUNTER — Ambulatory Visit: Payer: Medicaid Other | Admitting: Family Medicine

## 2020-08-25 ENCOUNTER — Other Ambulatory Visit: Payer: Self-pay

## 2020-08-25 VITALS — BP 112/77 | HR 105 | Temp 98.5°F | Resp 20 | Ht 65.0 in | Wt 322.0 lb

## 2020-08-25 DIAGNOSIS — R7989 Other specified abnormal findings of blood chemistry: Secondary | ICD-10-CM

## 2020-08-25 DIAGNOSIS — F411 Generalized anxiety disorder: Secondary | ICD-10-CM | POA: Diagnosis not present

## 2020-08-25 DIAGNOSIS — L03116 Cellulitis of left lower limb: Secondary | ICD-10-CM

## 2020-08-25 DIAGNOSIS — K219 Gastro-esophageal reflux disease without esophagitis: Secondary | ICD-10-CM | POA: Diagnosis not present

## 2020-08-25 DIAGNOSIS — E559 Vitamin D deficiency, unspecified: Secondary | ICD-10-CM | POA: Diagnosis not present

## 2020-08-25 DIAGNOSIS — Z1159 Encounter for screening for other viral diseases: Secondary | ICD-10-CM | POA: Diagnosis not present

## 2020-08-25 MED ORDER — SULFAMETHOXAZOLE-TRIMETHOPRIM 800-160 MG PO TABS: 1.0000 | ORAL_TABLET | Freq: Two times a day (BID) | ORAL | 0 refills | Status: DC

## 2020-08-25 NOTE — Patient Instructions (Addendum)
Follow-up with PCP Pearline Cables in 3 to 5 weeks call if you need to be seen sooner.  You are treated with Septra an antibiotic for cellulitis of your left  leg.  Please get labs today CBC and differential CMP and EGFR TSH HbA1c and vitamin D., and hepatitis C screen  Thanks for choosing [facility], we consider it a privelige to serve you.

## 2020-08-25 NOTE — Progress Notes (Signed)
   Doris Miller     MRN: [id number]      DOB: Sep 28, 1960   HPI Ms. Quang is here with a 5 day h/o redness, pain and swelling of left lower leg, no known trauma, has had cellulitis of this leg in the past No fevr chills or purulent drainage, slight weeping. Has improved slightly with elevation and soaking in epsom salts.  Encouraged to take vaccines and get mammogram scheduled , deferred on both  ROS Denies recent fever or chills. Denies sinus pressure, nasal congestion, ear pain or sore throat. Denies chest congestion, productive cough or wheezing. Denies chest pains, palpitations  Denies abdominal pain, nausea, vomiting,diarrhea or constipation.   Denies dysuria, frequency  PE  BP 112/77 (BP Location: Right Arm, Patient Position: Sitting, Cuff Size: Large)   Pulse (!) 105   Temp 98.5 F (36.9 C)   Resp 20   Ht 5\' 5"  (1.651 m)   Wt (!) 322 lb (146.1 kg)   LMP 06/13/2011   SpO2 95%   BMI 53.58 kg/m   Patient alert and oriented and in no cardiopulmonary distress.  HEENT: No facial asymmetry, EOMI,     Neck supple .  Chest: Clear to auscultation bilaterally.  CVS: RRR.  ABD: Soft non tender.   Ext: bilateral trace edema   Skin: erythemal with scant clear drainage of left leg, distal 1/3.  Psych: Good eye contact, normal affect. Memory intact not anxious or depressed appearing.  CNS: CN 2-12 intact, power,  normal throughout.no focal deficits noted.   Assessment & Plan  Morbid obesity (HCC)   worsening, appointment with PCP in the near future and baseline labs obtained    Cellulitis of left lower leg Septra prescribed x 1 week Leg elevation advised

## 2020-08-26 DIAGNOSIS — L03116 Cellulitis of left lower limb: Secondary | ICD-10-CM | POA: Insufficient documentation

## 2020-08-26 DIAGNOSIS — L03119 Cellulitis of unspecified part of limb: Secondary | ICD-10-CM | POA: Insufficient documentation

## 2020-08-26 LAB — CMP14+EGFR
ALT: 11 IU/L (ref 0–32)
AST: 10 IU/L (ref 0–40)
Albumin/Globulin Ratio: 1.6 (ref 1.2–2.2)
Albumin: 4 g/dL (ref 3.8–4.9)
Alkaline Phosphatase: 88 IU/L (ref 44–121)
BUN/Creatinine Ratio: 19 (ref 9–23)
BUN: 24 mg/dL (ref 6–24)
Bilirubin Total: 0.3 mg/dL (ref 0.0–1.2)
CO2: 21 mmol/L (ref 20–29)
Calcium: 9.1 mg/dL (ref 8.7–10.2)
Chloride: 101 mmol/L (ref 96–106)
Creatinine, Ser: 1.27 mg/dL — ABNORMAL HIGH (ref 0.57–1.00)
Globulin, Total: 2.5 g/dL (ref 1.5–4.5)
Glucose: 95 mg/dL (ref 65–99)
Potassium: 3.5 mmol/L (ref 3.5–5.2)
Sodium: 141 mmol/L (ref 134–144)
Total Protein: 6.5 g/dL (ref 6.0–8.5)
eGFR: 49 mL/min/{1.73_m2} — ABNORMAL LOW (ref >59)

## 2020-08-26 LAB — CBC WITH DIFFERENTIAL/PLATELET
Basophils Absolute: 0 10*3/uL (ref 0.0–0.2)
Basos: 1 %
EOS (ABSOLUTE): 0.2 10*3/uL (ref 0.0–0.4)
Eos: 3 %
Hematocrit: 39.4 % (ref 34.0–46.6)
Hemoglobin: 12.7 g/dL (ref 11.1–15.9)
Immature Grans (Abs): 0 10*3/uL (ref 0.0–0.1)
Immature Granulocytes: 0 %
Lymphocytes Absolute: 2.7 10*3/uL (ref 0.7–3.1)
Lymphs: 45 %
MCH: 27.2 pg (ref 26.6–33.0)
MCHC: 32.2 g/dL (ref 31.5–35.7)
MCV: 84 fL (ref 79–97)
Monocytes Absolute: 0.4 10*3/uL (ref 0.1–0.9)
Monocytes: 7 %
Neutrophils Absolute: 2.6 10*3/uL (ref 1.4–7.0)
Neutrophils: 44 %
Platelets: 310 10*3/uL (ref 150–450)
RBC: 4.67 x10E6/uL (ref 3.77–5.28)
RDW: 14.8 % (ref 11.7–15.4)
WBC: 5.9 10*3/uL (ref 3.4–10.8)

## 2020-08-26 LAB — TSH: TSH: 0.704 u[IU]/mL (ref 0.450–4.500)

## 2020-08-26 LAB — HEMOGLOBIN A1C
Est. average glucose Bld gHb Est-mCnc: 114 mg/dL
Hgb A1c MFr Bld: 5.6 % (ref 4.8–5.6)

## 2020-08-26 LAB — VITAMIN D 25 HYDROXY (VIT D DEFICIENCY, FRACTURES): Vit D, 25-Hydroxy: 49 ng/mL (ref 30.0–100.0)

## 2020-08-26 LAB — HEPATITIS C ANTIBODY: Hep C Virus Ab: 0.2 s/co ratio (ref 0.0–0.9)

## 2020-08-26 NOTE — Assessment & Plan Note (Signed)
   worsening, appointment with PCP in the near future and baseline labs obtained

## 2020-08-26 NOTE — Assessment & Plan Note (Signed)
Septra prescribed x 1 week Leg elevation advised

## 2020-09-08 ENCOUNTER — Other Ambulatory Visit: Payer: Self-pay

## 2020-09-08 ENCOUNTER — Encounter: Payer: Self-pay | Admitting: Nurse Practitioner

## 2020-09-08 ENCOUNTER — Other Ambulatory Visit: Payer: Self-pay | Admitting: Internal Medicine

## 2020-09-08 ENCOUNTER — Ambulatory Visit: Payer: Medicaid Other | Admitting: Nurse Practitioner

## 2020-09-08 VITALS — BP 137/81 | HR 98 | Temp 97.8°F | Ht 65.0 in | Wt 332.0 lb

## 2020-09-08 DIAGNOSIS — L03116 Cellulitis of left lower limb: Secondary | ICD-10-CM | POA: Diagnosis not present

## 2020-09-08 MED ORDER — DOXYCYCLINE HYCLATE 100 MG PO TABS: 100.0000 mg | ORAL_TABLET | Freq: Two times a day (BID) | ORAL | 0 refills | Status: DC

## 2020-09-08 NOTE — Assessment & Plan Note (Signed)
-  no improvementt with bactrim -culture obtained -Rx. Doxycycline -f/u in 1 week

## 2020-09-08 NOTE — Progress Notes (Signed)
Acute Office Visit  Subjective:    Patient ID: Doris Miller, female    DOB: September 03, 1960, 60 y.o.   MRN: [id number]  Chief Complaint  Patient presents with   Edema    L lower leg and foot swelling and redness, seen [facility] 7/12 and was given abt but it has gotten worse.     HPI Patient is in today for left leg cellulitis.  She was seen by Dr. Moshe Cipro on 7/12, and she was treated with bactrim x 1 week. She states that it never got better, and today it has an open wound that it draining.  Afebrile today   Past Medical History:  Diagnosis Date   Arthritis    Bronchospasm 03/12/2019   Depression    Encounter for hepatitis C screening test for low risk patient 03/12/2019   Encounter for screening for HIV 03/12/2019   Encounter for screening for malignant neoplasm of colon 03/12/2019   Encounter for screening mammogram for malignant neoplasm of breast 03/12/2019   GERD (gastroesophageal reflux disease)    Hypertension    Lymphedema    OA (osteoarthritis) of knee right     Past Surgical History:  Procedure Laterality Date   CHOLECYSTECTOMY     cyst removal left wrist Left    TUBAL LIGATION      Family History  Problem Relation Age of Onset   CAD Mother    Breast cancer Mother    Endometrial cancer Mother    CAD Father    Emphysema Father    COPD Father     Social History   Socioeconomic History   Marital status: Married    Spouse name: Not on file   Number of children: 3   Years of education: Not on file   Highest education level: Not on file  Occupational History   Not on file  Tobacco Use   Smoking status: Never   Smokeless tobacco: Never  Vaping Use   Vaping Use: Never used  Substance and Sexual Activity   Alcohol use: No   Drug use: No   Sexual activity: Yes    Birth control/protection: Surgical  Other Topics Concern   Not on file  Social History Narrative   Lives with husband live together (3 children everyone lives close together)   Cat-Cricket        Enjoys crafting-with daughter in Pharmac[employer], hospital, puzzles books       Diet: more snacking-not really eating meals    Caffeine: Dr Malachi Bonds- not sugar free   Water: 1-3 cups      Does not wears seat belt -smootherly feeling    Smoke and carbon monoxide detectors    Does not use phone while driving             Social Determinants of Radio broadcast assistant Strain: Not on file  Food Insecurity: Not on file  Transportation Needs: Not on file  Physical Activity: Not on file  Stress: Not on file  Social Connections: Not on file  Intimate Partner Violence: Not on file    Outpatient Medications Prior to Visit  Medication Sig Dispense Refill   albuterol (VENTOLIN HFA) 108 (90 Base) MCG/ACT inhaler Inhale 1-2 puffs into the lungs every 6 (six) hours as needed for wheezing or shortness of breath. 8 g 1   escitalopram (LEXAPRO) 20 MG tablet Take 1 tablet by mouth once daily 90 tablet 0   lisinopril-hydrochlorothiazide (ZESTORETIC) 10-12.5 MG tablet Take 1 tablet by mouth  once daily 90 tablet 0   omeprazole (PRILOSEC) 20 MG capsule Take 1 capsule by mouth once daily 90 capsule 0   Vitamin D, Ergocalciferol, (DRISDOL) 1.25 MG (50000 UNIT) CAPS capsule Take 1 capsule by mouth once a week 12 capsule 0   busPIRone (BUSPAR) 5 MG tablet Take 1 tablet (5 mg total) by mouth 3 (three) times daily. 90 tablet 1   gabapentin (NEURONTIN) 100 MG capsule Take 1 capsule (100 mg total) by mouth at bedtime. 15 capsule 0   promethazine-dextromethorphan (PROMETHAZINE-DM) 6.25-15 MG/5ML syrup Take 2.5 mLs by mouth 2 (two) times daily as needed for cough. 118 mL 0   traZODone (DESYREL) 50 MG tablet Take 1 tablet (50 mg total) by mouth at bedtime. 30 tablet 1   sulfamethoxazole-trimethoprim (BACTRIM DS) 800-160 MG tablet Take 1 tablet by mouth 2 (two) times daily. (Patient not taking: Reported on 09/08/2020) 14 tablet 0   No facility-administered medications prior to visit.    Allergies  Allergen Reactions    Codeine Shortness Of Breath   Diclofenac Rash    Review of Systems  Constitutional: Negative.   Skin:  Positive for wound.       Redness and open wound to left lower leg      Objective:    Physical Exam Constitutional:      General: She is not in acute distress.    Appearance: Normal appearance. She is obese. She is not ill-appearing.  Skin:    Comments: Erythema from mid left shin to ankle with open wound the size of a nickel, purulent drainage  Neurological:     Mental Status: She is alert.    BP 137/81 (BP Location: Right Arm, Patient Position: Sitting, Cuff Size: Large)   Pulse 98   Temp 97.8 F (36.6 C) (Temporal)   Ht '5\' 5"'  (1.651 m)   Wt (!) 332 lb (150.6 kg)   LMP 06/13/2011   SpO2 93%   BMI 55.25 kg/m  Wt Readings from Last 3 Encounters:  09/08/20 (!) 332 lb (150.6 kg)  08/25/20 (!) 322 lb (146.1 kg)  10/15/19 (!) 337 lb (152.9 kg)    Health Maintenance Due  Topic Date Due   PAP SMEAR-Modifier  Never done   COLONOSCOPY (Pts 45-72yr Insurance coverage will need to be confirmed)  Never done    There are no preventive care reminders to display for this patient.   Lab Results  Component Value Date   TSH 0.704 08/25/2020   Lab Results  Component Value Date   WBC 5.9 08/25/2020   HGB 12.7 08/25/2020   HCT 39.4 08/25/2020   MCV 84 08/25/2020   PLT 310 08/25/2020   Lab Results  Component Value Date   NA 141 08/25/2020   K 3.5 08/25/2020   CO2 21 08/25/2020   GLUCOSE 95 08/25/2020   BUN 24 08/25/2020   CREATININE 1.27 (H) 08/25/2020   BILITOT 0.3 08/25/2020   ALKPHOS 88 08/25/2020   AST 10 08/25/2020   ALT 11 08/25/2020   PROT 6.5 08/25/2020   ALBUMIN 4.0 08/25/2020   CALCIUM 9.1 08/25/2020   ANIONGAP 10 10/25/2013   EGFR 49 (L) 08/25/2020   Lab Results  Component Value Date   CHOL 176 03/12/2019   Lab Results  Component Value Date   HDL 66 03/12/2019   Lab Results  Component Value Date   LDLCALC 88 03/12/2019   Lab Results   Component Value Date   TRIG 122 03/12/2019   Lab Results  Component Value Date   CHOLHDL 2.7 03/12/2019   Lab Results  Component Value Date   HGBA1C 5.6 08/25/2020       Assessment & Plan:   Problem List Items Addressed This Visit       Other   Cellulitis of left lower leg    -no improvementt with bactrim -culture obtained -Rx. Doxycycline -f/u in 1 week        Other Visit Diagnoses     Cellulitis of left lower extremity    -  Primary   Relevant Orders   Wound culture        Meds ordered this encounter  Medications   doxycycline (VIBRA-TABS) 100 MG tablet    Sig: Take 1 tablet (100 mg total) by mouth 2 (two) times daily.    Dispense:  20 tablet    Refill:  0     Noreene Larsson, NP

## 2020-09-08 NOTE — Patient Instructions (Signed)
If fever develops or wound gets worse, return to clinic sooner than 1 week. Otherwise, the doxycycline should get rid of the cellulitis, and we will use the culture to make treatment changes if needed.

## 2020-09-14 NOTE — Progress Notes (Signed)
The wound cultre grew penicillin-resistant staph.  The doxycycline that we sent in should work fine, are you noticing a difference?

## 2020-09-15 ENCOUNTER — Other Ambulatory Visit: Payer: Self-pay | Admitting: Nurse Practitioner

## 2020-09-15 DIAGNOSIS — L03116 Cellulitis of left lower limb: Secondary | ICD-10-CM

## 2020-09-15 MED ORDER — DOXYCYCLINE HYCLATE 100 MG PO TABS
100.0000 mg | ORAL_TABLET | Freq: Two times a day (BID) | ORAL | 0 refills | Status: DC
Start: 2020-09-15 — End: 2020-09-18

## 2020-09-15 NOTE — Progress Notes (Signed)
I sent in another round of doxycycline, and we are referring her to the wound clinic.

## 2020-09-16 ENCOUNTER — Telehealth: Payer: Self-pay

## 2020-09-16 NOTE — Telephone Encounter (Signed)
Patient called asking her referral to the wound center be sent to [facility] instead of [place], needs a sooner appt before September.  Call back # [phone]

## 2020-09-17 LAB — WOUND CULTURE

## 2020-09-18 ENCOUNTER — Other Ambulatory Visit: Payer: Self-pay

## 2020-09-18 ENCOUNTER — Encounter: Payer: Self-pay | Admitting: Nurse Practitioner

## 2020-09-18 ENCOUNTER — Ambulatory Visit: Payer: Medicaid Other | Admitting: Nurse Practitioner

## 2020-09-18 VITALS — BP 125/79 | HR 100 | Temp 97.5°F | Ht 65.0 in | Wt 353.0 lb

## 2020-09-18 DIAGNOSIS — L03116 Cellulitis of left lower limb: Secondary | ICD-10-CM

## 2020-09-18 MED ORDER — NUZYRA 150 MG PO TABS: ORAL_TABLET | ORAL | 0 refills | Status: AC

## 2020-09-18 NOTE — Assessment & Plan Note (Addendum)
-  some improvement after 1 course of doxycycline -she has wound clinic referral in process -on slight improvement since starting doxycycline -Rx. Riki Altes; stop doxy if she is able to pick this up -if unable to get Nuzyra, continue doxy -ankle 21" circumference measured at level of her open wound

## 2020-09-18 NOTE — Progress Notes (Signed)
Acute Office Visit  Subjective:    Patient ID: Doris Miller, female    DOB: 26-Oct-1960, 60 y.o.   MRN: [id number]  Chief Complaint  Patient presents with   Leg Swelling    Follow up-cellulitis, she does not see a lot of improvement. Still swelling and draining    HPI Patient is in today for left leg cellulitis. She had culture that grew PCN-resistant staph. She was started on doxycycline for 10 days, but cellulitis is still present. She was started on another 10 days of doxy since the wound is somewhat smaller, but she still has pain, erythema, and swelling.  Past Medical History:  Diagnosis Date   Arthritis    Bronchospasm 03/12/2019   Depression    Encounter for hepatitis C screening test for low risk patient 03/12/2019   Encounter for screening for HIV 03/12/2019   Encounter for screening for malignant neoplasm of colon 03/12/2019   Encounter for screening mammogram for malignant neoplasm of breast 03/12/2019   GERD (gastroesophageal reflux disease)    Hypertension    Lymphedema    OA (osteoarthritis) of knee right     Past Surgical History:  Procedure Laterality Date   CHOLECYSTECTOMY     cyst removal left wrist Left    TUBAL LIGATION      Family History  Problem Relation Age of Onset   CAD Mother    Breast cancer Mother    Endometrial cancer Mother    CAD Father    Emphysema Father    COPD Father     Social History   Socioeconomic History   Marital status: Married    Spouse name: Not on file   Number of children: 3   Years of education: Not on file   Highest education level: Not on file  Occupational History   Not on file  Tobacco Use   Smoking status: Never   Smokeless tobacco: Never  Vaping Use   Vaping Use: Never used  Substance and Sexual Activity   Alcohol use: No   Drug use: No   Sexual activity: Yes    Birth control/protection: Surgical  Other Topics Concern   Not on file  Social History Narrative   Lives with husband live together (3  children everyone lives close together)   Cat-Cricket       Enjoys crafting-with daughter in [employer], hospital, puzzles books       Diet: more snacking-not really eating meals    Caffeine: Dr Malachi Bonds- not sugar free   Water: 1-3 cups      Does not wears seat belt -smootherly feeling    Smoke and carbon monoxide detectors    Does not use phone while driving             Social Determinants of Radio broadcast assistant Strain: Not on file  Food Insecurity: Not on file  Transportation Needs: Not on file  Physical Activity: Not on file  Stress: Not on file  Social Connections: Not on file  Intimate Partner Violence: Not on file    Outpatient Medications Prior to Visit  Medication Sig Dispense Refill   albuterol (VENTOLIN HFA) 108 (90 Base) MCG/ACT inhaler Inhale 1-2 puffs into the lungs every 6 (six) hours as needed for wheezing or shortness of breath. 8 g 1   escitalopram (LEXAPRO) 20 MG tablet Take 1 tablet by mouth once daily 90 tablet 0   lisinopril-hydrochlorothiazide (ZESTORETIC) 10-12.5 MG tablet Take 1 tablet by mouth once daily  90 tablet 0   omeprazole (PRILOSEC) 20 MG capsule Take 1 capsule by mouth once daily 90 capsule 0   Vitamin D, Ergocalciferol, (DRISDOL) 1.25 MG (50000 UNIT) CAPS capsule Take 1 capsule by mouth once a week 12 capsule 0   doxycycline (VIBRA-TABS) 100 MG tablet Take 1 tablet (100 mg total) by mouth 2 (two) times daily. 20 tablet 0   No facility-administered medications prior to visit.    Allergies  Allergen Reactions   Codeine Shortness Of Breath   Diclofenac Rash    Review of Systems  Constitutional: Negative.   Skin:  Positive for wound.       Left leg cellulitis      Objective:    Physical Exam Constitutional:      Appearance: Normal appearance. She is obese.  Skin:    Comments: Cellulitis to left leg, erythema present; has open wound size of quarter; no purulent drainage like last OV  Neurological:     Mental Status: She is alert.    BP  125/79 (BP Location: Right Arm, Patient Position: Sitting, Cuff Size: Large)   Pulse 100   Temp (!) 97.5 F (36.4 C) (Temporal)   Ht '5\' 5"'  (1.651 m)   Wt (!) 353 lb (160.1 kg)   LMP 06/13/2011   SpO2 95%   BMI 58.74 kg/m  Wt Readings from Last 3 Encounters:  09/18/20 (!) 353 lb (160.1 kg)  09/08/20 (!) 332 lb (150.6 kg)  08/25/20 (!) 322 lb (146.1 kg)    Health Maintenance Due  Topic Date Due   PAP SMEAR-Modifier  Never done   COLONOSCOPY (Pts 45-2yr Insurance coverage will need to be confirmed)  Never done   INFLUENZA VACCINE  09/14/2020    There are no preventive care reminders to display for this patient.   Lab Results  Component Value Date   TSH 0.704 08/25/2020   Lab Results  Component Value Date   WBC 5.9 08/25/2020   HGB 12.7 08/25/2020   HCT 39.4 08/25/2020   MCV 84 08/25/2020   PLT 310 08/25/2020   Lab Results  Component Value Date   NA 141 08/25/2020   K 3.5 08/25/2020   CO2 21 08/25/2020   GLUCOSE 95 08/25/2020   BUN 24 08/25/2020   CREATININE 1.27 (H) 08/25/2020   BILITOT 0.3 08/25/2020   ALKPHOS 88 08/25/2020   AST 10 08/25/2020   ALT 11 08/25/2020   PROT 6.5 08/25/2020   ALBUMIN 4.0 08/25/2020   CALCIUM 9.1 08/25/2020   ANIONGAP 10 10/25/2013   EGFR 49 (L) 08/25/2020   Lab Results  Component Value Date   CHOL 176 03/12/2019   Lab Results  Component Value Date   HDL 66 03/12/2019   Lab Results  Component Value Date   LDLCALC 88 03/12/2019   Lab Results  Component Value Date   TRIG 122 03/12/2019   Lab Results  Component Value Date   CHOLHDL 2.7 03/12/2019   Lab Results  Component Value Date   HGBA1C 5.6 08/25/2020       Assessment & Plan:   Problem List Items Addressed This Visit       Other   Cellulitis of left lower leg - Primary    -some improvement after 1 course of doxycycline -she has wound clinic referral in process -on slight improvement since starting doxycycline -Rx. NElesa Hacker stop doxy if she is able  to pick this up -if unable to get NSamoa continue doxy  Relevant Medications   Omadacycline Tosylate (NUZYRA) 150 MG TABS     Meds ordered this encounter  Medications   Omadacycline Tosylate (NUZYRA) 150 MG TABS    Sig: Take 450 mg by mouth daily for 2 days, THEN 300 mg daily for 12 days.    Dispense:  28 tablet    Refill:  0     Noreene Larsson, NP

## 2020-09-22 ENCOUNTER — Other Ambulatory Visit: Payer: Self-pay | Admitting: Nurse Practitioner

## 2020-09-22 ENCOUNTER — Ambulatory Visit: Payer: Medicaid Other | Admitting: Nurse Practitioner

## 2020-09-22 DIAGNOSIS — K219 Gastro-esophageal reflux disease without esophagitis: Secondary | ICD-10-CM

## 2020-09-29 ENCOUNTER — Other Ambulatory Visit: Payer: Self-pay | Admitting: Nurse Practitioner

## 2020-09-29 DIAGNOSIS — F320 Major depressive disorder, single episode, mild: Secondary | ICD-10-CM

## 2020-10-06 ENCOUNTER — Ambulatory Visit: Payer: Medicaid Other | Admitting: Nurse Practitioner

## 2020-10-06 ENCOUNTER — Encounter: Payer: Self-pay | Admitting: Nurse Practitioner

## 2020-10-06 ENCOUNTER — Other Ambulatory Visit: Payer: Self-pay

## 2020-10-06 DIAGNOSIS — R7989 Other specified abnormal findings of blood chemistry: Secondary | ICD-10-CM

## 2020-10-06 DIAGNOSIS — M170 Bilateral primary osteoarthritis of knee: Secondary | ICD-10-CM | POA: Diagnosis not present

## 2020-10-06 DIAGNOSIS — M1710 Unilateral primary osteoarthritis, unspecified knee: Secondary | ICD-10-CM | POA: Insufficient documentation

## 2020-10-06 DIAGNOSIS — M179 Osteoarthritis of knee, unspecified: Secondary | ICD-10-CM | POA: Insufficient documentation

## 2020-10-06 MED ORDER — MELOXICAM 7.5 MG PO TABS: 7.5000 mg | ORAL_TABLET | Freq: Every day | ORAL | 0 refills | Status: DC

## 2020-10-06 NOTE — Patient Instructions (Signed)
Diclofenac caused a rash in you before, and the meloxicam that I sent in is in the same drug class. If you develop a rash or have signs of an allergy, stop the the meloxicam immediately.

## 2020-10-06 NOTE — Assessment & Plan Note (Signed)
-  she has been taking tylenol; continue this -Rx. meloxicam

## 2020-10-06 NOTE — Progress Notes (Signed)
Acute Office Visit  Subjective:    Patient ID: Doris Miller, female    DOB: 1960-10-21, 60 y.o.   MRN: [id number]  Chief Complaint  Patient presents with   Wound Check    Follow up    Wound Check  Patient is in today for left leg wound. She was treated with 2 rounds of doxycycline and then [facility] for non-healing leg wound. She states she was unable to pick up the [place] d/t price, but the doxycycline helped her wound heal and she is having no pain and the wound has closed.  Past Medical History:  Diagnosis Date   Arthritis    Bronchospasm 03/12/2019   Depression    Encounter for hepatitis C screening test for low risk patient 03/12/2019   Encounter for screening for HIV 03/12/2019   Encounter for screening for malignant neoplasm of colon 03/12/2019   Encounter for screening mammogram for malignant neoplasm of breast 03/12/2019   GERD (gastroesophageal reflux disease)    Hypertension    Lymphedema    OA (osteoarthritis) of knee right     Past Surgical History:  Procedure Laterality Date   CHOLECYSTECTOMY     cyst removal left wrist Left    TUBAL LIGATION      Family History  Problem Relation Age of Onset   CAD Mother    Breast cancer Mother    Endometrial cancer Mother    CAD Father    Emphysema Father    COPD Father     Social History   Socioeconomic History   Marital status: Married    Spouse name: Not on file   Number of children: 3   Years of education: Not on file   Highest education level: Not on file  Occupational History   Not on file  Tobacco Use   Smoking status: Never   Smokeless tobacco: Never  Vaping Use   Vaping Use: Never used  Substance and Sexual Activity   Alcohol use: No   Drug use: No   Sexual activity: Yes    Birth control/protection: Surgical  Other Topics Concern   Not on file  Social History Narrative   Lives with husband live together (3 children everyone lives close together)   Cat-Cricket       Enjoys crafting-with  daughter in [employer], hospital, puzzles books       Diet: more snacking-not really eating meals    Caffeine: Dr Malachi Bonds- not sugar free   Water: 1-3 cups      Does not wears seat belt -smootherly feeling    Smoke and carbon monoxide detectors    Does not use phone while driving             Social Determinants of Radio broadcast assistant Strain: Not on file  Food Insecurity: Not on file  Transportation Needs: Not on file  Physical Activity: Not on file  Stress: Not on file  Social Connections: Not on file  Intimate Partner Violence: Not on file    Outpatient Medications Prior to Visit  Medication Sig Dispense Refill   albuterol (VENTOLIN HFA) 108 (90 Base) MCG/ACT inhaler Inhale 1-2 puffs into the lungs every 6 (six) hours as needed for wheezing or shortness of breath. 8 g 1   escitalopram (LEXAPRO) 20 MG tablet Take 1 tablet by mouth once daily 90 tablet 0   lisinopril-hydrochlorothiazide (ZESTORETIC) 10-12.5 MG tablet Take 1 tablet by mouth once daily 90 tablet 0   omeprazole (PRILOSEC) 20  MG capsule Take 1 capsule by mouth once daily 90 capsule 0   Vitamin D, Ergocalciferol, (DRISDOL) 1.25 MG (50000 UNIT) CAPS capsule Take 1 capsule by mouth once a week 12 capsule 0   No facility-administered medications prior to visit.    Allergies  Allergen Reactions   Codeine Shortness Of Breath   Diclofenac Rash    Review of Systems  Constitutional: Negative.   Musculoskeletal:        Bilateral knee pain  Skin: Negative.       Objective:    Physical Exam Constitutional:      Appearance: Normal appearance. She is obese.  Musculoskeletal:        General: No swelling, tenderness or deformity.  Skin:    Comments: Dry skin; wound has healed and erythema has resolved  Neurological:     Mental Status: She is alert.    BP 133/84 (BP Location: Right Arm, Patient Position: Sitting, Cuff Size: Large)   Pulse 90   Temp 98 F (36.7 C) (Oral)   Ht '5\' 5"'  (1.651 m)   Wt (!) 357 lb (161.9  kg)   LMP 06/13/2011   SpO2 92%   BMI 59.41 kg/m  Wt Readings from Last 3 Encounters:  10/06/20 (!) 357 lb (161.9 kg)  09/18/20 (!) 353 lb (160.1 kg)  09/08/20 (!) 332 lb (150.6 kg)    Health Maintenance Due  Topic Date Due   PAP SMEAR-Modifier  Never done   COLONOSCOPY (Pts 45-55yr Insurance coverage will need to be confirmed)  Never done   INFLUENZA VACCINE  09/14/2020    There are no preventive care reminders to display for this patient.   Lab Results  Component Value Date   TSH 0.704 08/25/2020   Lab Results  Component Value Date   WBC 5.9 08/25/2020   HGB 12.7 08/25/2020   HCT 39.4 08/25/2020   MCV 84 08/25/2020   PLT 310 08/25/2020   Lab Results  Component Value Date   NA 141 08/25/2020   K 3.5 08/25/2020   CO2 21 08/25/2020   GLUCOSE 95 08/25/2020   BUN 24 08/25/2020   CREATININE 1.27 (H) 08/25/2020   BILITOT 0.3 08/25/2020   ALKPHOS 88 08/25/2020   AST 10 08/25/2020   ALT 11 08/25/2020   PROT 6.5 08/25/2020   ALBUMIN 4.0 08/25/2020   CALCIUM 9.1 08/25/2020   ANIONGAP 10 10/25/2013   EGFR 49 (L) 08/25/2020   Lab Results  Component Value Date   CHOL 176 03/12/2019   Lab Results  Component Value Date   HDL 66 03/12/2019   Lab Results  Component Value Date   LDLCALC 88 03/12/2019   Lab Results  Component Value Date   TRIG 122 03/12/2019   Lab Results  Component Value Date   CHOLHDL 2.7 03/12/2019   Lab Results  Component Value Date   HGBA1C 5.6 08/25/2020       Assessment & Plan:   Problem List Items Addressed This Visit       Musculoskeletal and Integument   Osteoarthritis, knee    -she has been taking tylenol; continue this -Rx. meloxicam      Relevant Medications   meloxicam (MOBIC) 7.5 MG tablet     Other   Morbid obesity (HMora - Primary   Relevant Orders   CBC with Differential/Platelet   CMP14+EGFR   Lipid Panel With LDL/HDL Ratio   Other Visit Diagnoses     Abnormal thyroid blood test  Relevant  Orders   TSH        Meds ordered this encounter  Medications   meloxicam (MOBIC) 7.5 MG tablet    Sig: Take 1 tablet (7.5 mg total) by mouth daily.    Dispense:  30 tablet    Refill:  0     Noreene Larsson, NP

## 2020-10-20 ENCOUNTER — Encounter (HOSPITAL_BASED_OUTPATIENT_CLINIC_OR_DEPARTMENT_OTHER): Payer: Medicaid Other | Admitting: Internal Medicine

## 2020-12-08 ENCOUNTER — Other Ambulatory Visit: Payer: Self-pay | Admitting: Internal Medicine

## 2020-12-16 ENCOUNTER — Emergency Department (HOSPITAL_COMMUNITY): Payer: Medicaid Other

## 2020-12-16 ENCOUNTER — Other Ambulatory Visit: Payer: Self-pay

## 2020-12-16 ENCOUNTER — Encounter (HOSPITAL_COMMUNITY): Payer: Self-pay

## 2020-12-16 ENCOUNTER — Emergency Department (HOSPITAL_COMMUNITY)
Admission: EM | Admit: 2020-12-16 | Discharge: 2020-12-16 | Disposition: A | Discharge status: Home / Self Care | Payer: Medicaid Other | Attending: Emergency Medicine | Admitting: Emergency Medicine

## 2020-12-16 DIAGNOSIS — R52 Pain, unspecified: Secondary | ICD-10-CM | POA: Diagnosis not present

## 2020-12-16 DIAGNOSIS — M25552 Pain in left hip: Secondary | ICD-10-CM | POA: Diagnosis not present

## 2020-12-16 DIAGNOSIS — Z79899 Other long term (current) drug therapy: Secondary | ICD-10-CM | POA: Diagnosis not present

## 2020-12-16 DIAGNOSIS — W19XXXA Unspecified fall, initial encounter: Secondary | ICD-10-CM | POA: Diagnosis not present

## 2020-12-16 DIAGNOSIS — I10 Essential (primary) hypertension: Secondary | ICD-10-CM | POA: Diagnosis not present

## 2020-12-16 DIAGNOSIS — Y92512 Supermarket, store or market as the place of occurrence of the external cause: Secondary | ICD-10-CM | POA: Insufficient documentation

## 2020-12-16 DIAGNOSIS — Y9301 Activity, walking, marching and hiking: Secondary | ICD-10-CM | POA: Insufficient documentation

## 2020-12-16 NOTE — ED Provider Notes (Signed)
[facility] EMERGENCY DEPARTMENT Provider Note   CSN: [id number] Arrival date & time: 12/16/20  1139     History Chief Complaint  Patient presents with   Doris Miller is a 60 y.o. female.  HPI  Patient with significant medical history including arthritis presents  with chief complaint of a fall.  Patient states today while she was at the store as she was walking and her walker got away from her causing her to fall onto her left side.  She denies hitting her head, losing conscious, is not on anticoagulant she states she landed directly onto her hip, she states that she was unable to get up as she could not do so due to arthritis in her left knee and pain.  She denies neck pain, back pain, chest pain, pain in her upper extremities or her lower extremities. She thinks that she mostly just bruised her hip but just wanted to be sure.  She has not had any pain medications, pain does not radiate, remains directly in her hip, she states that lying down makes the pain better but movement makes the pain worse.  Past Medical History:  Diagnosis Date   Arthritis    Bronchospasm 03/12/2019   Depression    Encounter for hepatitis C screening test for low risk patient 03/12/2019   Encounter for screening for HIV 03/12/2019   Encounter for screening for malignant neoplasm of colon 03/12/2019   Encounter for screening mammogram for malignant neoplasm of breast 03/12/2019   GERD (gastroesophageal reflux disease)    Hypertension    Lymphedema    OA (osteoarthritis) of knee right     Patient Active Problem List   Diagnosis Date Noted   Osteoarthritis, knee 10/06/2020   Cellulitis of left lower leg 08/26/2020   Sciatica of left side 09/12/2019   GAD (generalized anxiety disorder) 07/05/2019   Gastroesophageal reflux disease 04/04/2019   Morbid obesity (HCC) 03/12/2019   Depression, major, single episode, mild (HCC) 03/12/2019   Vitamin D deficiency 03/12/2019   Fatigue 03/12/2019     Past Surgical History:  Procedure Laterality Date   CHOLECYSTECTOMY     cyst removal left wrist Left    TUBAL LIGATION       OB History   No obstetric history on file.     Family History  Problem Relation Age of Onset   CAD Mother    Breast cancer Mother    Endometrial cancer Mother    CAD Father    Emphysema Father    COPD Father     Social History   Tobacco Use   Smoking status: Never   Smokeless tobacco: Never  Vaping Use   Vaping Use: Never used  Substance Use Topics   Alcohol use: No   Drug use: No    Home Medications Prior to Admission medications   Medication Sig Start Date End Date Taking? Authorizing Provider  albuterol (VENTOLIN HFA) 108 (90 Base) MCG/ACT inhaler Inhale 1-2 puffs into the lungs every 6 (six) hours as needed for wheezing or shortness of breath. 03/08/19   Freddy Finner, NP  escitalopram (LEXAPRO) 20 MG tablet Take 1 tablet by mouth once daily 09/29/20   Heather Roberts, NP  lisinopril-hydrochlorothiazide (ZESTORETIC) 10-12.5 MG tablet Take 1 tablet by mouth once daily 12/08/20   Anabel Halon, MD  meloxicam (MOBIC) 7.5 MG tablet Take 1 tablet (7.5 mg total) by mouth daily. 10/06/20   Heather Roberts, NP  omeprazole (PRILOSEC) 20 MG capsule Take 1 capsule by mouth once daily 09/22/20   Heather Roberts, NP  Vitamin D, Ergocalciferol, (DRISDOL) 1.25 MG (50000 UNIT) CAPS capsule Take 1 capsule by mouth once a week 08/05/20   Anabel Halon, MD    Allergies    Codeine and Diclofenac  Review of Systems   Review of Systems  Constitutional:  Negative for chills and fever.  HENT:  Negative for congestion.   Respiratory:  Negative for shortness of breath.   Cardiovascular:  Negative for chest pain.  Gastrointestinal:  Negative for abdominal pain.  Genitourinary:  Negative for enuresis.  Musculoskeletal:  Negative for back pain.       Right hip pain   Skin:  Negative for rash.  Neurological:  Negative for dizziness.  Hematological:  Does  not bruise/bleed easily.   Physical Exam Updated Vital Signs BP 90/68   Pulse (!) 59   Temp 98.2 F (36.8 C)   Resp 18   Ht 5\' 5"  (1.651 m)   Wt (!) 149.7 kg   LMP 06/13/2011   SpO2 100%   BMI 54.91 kg/m   Physical Exam Vitals and nursing note reviewed.  Constitutional:      General: She is not in acute distress.    Appearance: She is not ill-appearing.  HENT:     Head: Normocephalic and atraumatic.     Comments: No deformity of the head present, no raccoon eyes or battle sign noted.    Nose: No congestion.  Eyes:     Conjunctiva/sclera: Conjunctivae normal.  Cardiovascular:     Rate and Rhythm: Normal rate and regular rhythm.     Pulses: Normal pulses.     Heart sounds: No murmur heard.   No friction rub. No gallop.  Pulmonary:     Effort: No respiratory distress.     Breath sounds: No wheezing, rhonchi or rales.  Chest:     Chest wall: No tenderness.  Musculoskeletal:     Comments: Spine was palpated was nontender to palpation, no step-off deformities noted.  No pelvis instability, no leg shortening, no external or internal rotation the lower extremities, she did have some pain in her left trochanter but no gross deformities noted.  She had full range of motion in her in her upper lower extremities, flexion extension of the left hip did not elicit the pain.  Skin:    General: Skin is warm and dry.  Neurological:     Mental Status: She is alert.     Comments: No facial asymmetry, no slurring of her words, able to follow two-step commands, no unilateral weakness present, gait was fully intact.  Psychiatric:        Mood and Affect: Mood normal.    ED Results / Procedures / Treatments   Labs (all labs ordered are listed, but only abnormal results are displayed) Labs Reviewed - No data to display  EKG None  Radiology DG Hip Unilat With Pelvis 2-3 Views Left  Result Date: 12/16/2020 CLINICAL DATA:  13/03/2020.  Left hip pain. EXAM: DG HIP (WITH OR WITHOUT PELVIS)  2-3V LEFT COMPARISON:  Radiographs 11/13/2018 FINDINGS: Both hips are normally located. Mild degenerative changes bilaterally. No fracture or plain film evidence of AVN. The pubic symphysis and SI joints are intact. No pelvic fractures or bone lesions. IMPRESSION: Mild degenerative changes but no acute bony findings. Electronically Signed   By: 11/15/2018 M.D.   On: 12/16/2020 14:44    Procedures  Procedures   Medications Ordered in ED Medications - No data to display  ED Course  I have reviewed the triage vital signs and the nursing notes.  Pertinent labs & imaging results that were available during my care of the patient were reviewed by me and considered in my medical decision making (see chart for details).    MDM Rules/Calculators/A&P                          Initial impression-presents with left hip pain after a fall.  She is alert, does not appear in acute stress, vital signs reassuring.  Will obtainImaging of the left hip for further evaluation.  Work-up-DG of left hip negative for acute findings.  Reassessment-patient was updated on imaging, she was ambulated without difficulty.  Patient states she is ready for discharge.  Rule out- I have low suspicion for septic arthritis patient is a 2 of etiology, pain happened after she had a fall.  Low suspicion for fracture or dislocation as x-ray does not feel any significant findings. low suspicion for ligament or tendon damage as area was palpated no gross defects noted, they had full range of motion.   Low suspicion for compartment syndrome as area was palpated it was soft to the touch, neurovascular fully intact.   Plan-  Left hip pain-likely a strain after a fall, will recommend over-the-counter pain medications, ice, heat follow-up with PCP as needed.  Vital signs have remained stable, no indication for hospital admission.  Patient given at home care as well strict return precautions.  Patient verbalized that they understood  agreed to said plan.  Final Clinical Impression(s) / ED Diagnoses Final diagnoses:  Fall, initial encounter  Left hip pain    Rx / DC Orders ED Discharge Orders     None        Carroll Sage, PA-C 12/16/20 1541    Bethann Berkshire, MD 12/18/20 [phone]

## 2020-12-16 NOTE — Discharge Instructions (Signed)
You have been seen here for left hip pain. I recommend taking over-the-counter pain medications like ibuprofen and/or Tylenol every 6 as needed.  Please follow dosage and on the back of bottle.  I also recommend applying heat to the area and stretching out the muscles as this will help decrease stiffness and pain.  I have given you information on exercises please follow.  Please follow PCP for further evaluation.  Come back to the emergency department if you develop chest pain, shortness of breath, severe abdominal pain, uncontrolled nausea, vomiting, diarrhea.

## 2020-12-16 NOTE — ED Triage Notes (Signed)
Patient ambulating with walker at store when it "got away" from her and she fell. Complaining of left hip pain. Ambulatory in ED.

## 2020-12-17 ENCOUNTER — Telehealth: Payer: Self-pay

## 2020-12-17 NOTE — Telephone Encounter (Signed)
Transition Care Management Unsuccessful Follow-up Telephone Call  Date of discharge and from where:  12/16/2020 from [facility]  Attempts:  1st Attempt  Reason for unsuccessful TCM follow-up call:  Left voice message

## 2020-12-18 NOTE — Telephone Encounter (Signed)
Transition Care Management Follow-up Telephone Call Date of discharge and from where: 12/16/2020 from [facility] How have you been since you were released from the hospital? Pt stated that she is feeling well, just a little sore. Pt did not have any questions or concerns at this time.  Any questions or concerns? No  Items Reviewed: Did the pt receive and understand the discharge instructions provided? Yes  Medications obtained and verified? Yes  Other? No  Any new allergies since your discharge? No  Dietary orders reviewed? No Do you have support at home? Yes   Functional Questionnaire: (I = Independent and D = Dependent) ADLs: I  Bathing/Dressing- I  Meal Prep- I  Eating- I  Maintaining continence- I  Transferring/Ambulation- I  Managing Meds- I   Follow up appointments reviewed:  PCP Hospital f/u appt confirmed? Yes  Scheduled to see Bjorn Pippin, NP on 01/06/2021 @ 8:00am. Specialist Hospital f/u appt confirmed? No   Are transportation arrangements needed? No  If their condition worsens, is the pt aware to call PCP or go to the Emergency Dept.? Yes Was the patient provided with contact information for the PCP's office or ED? Yes Was to pt encouraged to call back with questions or concerns? Yes

## 2020-12-21 ENCOUNTER — Other Ambulatory Visit: Payer: Self-pay | Admitting: Nurse Practitioner

## 2020-12-21 DIAGNOSIS — K219 Gastro-esophageal reflux disease without esophagitis: Secondary | ICD-10-CM

## 2020-12-23 ENCOUNTER — Other Ambulatory Visit: Payer: Self-pay | Admitting: Nurse Practitioner

## 2020-12-23 DIAGNOSIS — K219 Gastro-esophageal reflux disease without esophagitis: Secondary | ICD-10-CM

## 2020-12-28 ENCOUNTER — Other Ambulatory Visit: Payer: Self-pay | Admitting: Nurse Practitioner

## 2020-12-28 DIAGNOSIS — F320 Major depressive disorder, single episode, mild: Secondary | ICD-10-CM

## 2021-01-06 ENCOUNTER — Ambulatory Visit: Payer: Medicaid Other | Admitting: Nurse Practitioner

## 2021-01-15 ENCOUNTER — Ambulatory Visit: Payer: Medicaid Other | Admitting: Nurse Practitioner

## 2021-01-28 ENCOUNTER — Ambulatory Visit: Payer: Medicaid Other | Admitting: Nurse Practitioner

## 2021-03-23 ENCOUNTER — Other Ambulatory Visit: Payer: Self-pay

## 2021-03-23 ENCOUNTER — Encounter: Payer: Self-pay | Admitting: Nurse Practitioner

## 2021-03-23 ENCOUNTER — Ambulatory Visit: Payer: Medicaid Other | Admitting: Nurse Practitioner

## 2021-03-23 VITALS — BP 131/66 | HR 104 | Ht 65.0 in | Wt 351.1 lb

## 2021-03-23 DIAGNOSIS — R519 Headache, unspecified: Secondary | ICD-10-CM | POA: Insufficient documentation

## 2021-03-23 DIAGNOSIS — Z9189 Other specified personal risk factors, not elsewhere classified: Secondary | ICD-10-CM | POA: Insufficient documentation

## 2021-03-23 DIAGNOSIS — Z1211 Encounter for screening for malignant neoplasm of colon: Secondary | ICD-10-CM

## 2021-03-23 DIAGNOSIS — R55 Syncope and collapse: Secondary | ICD-10-CM

## 2021-03-23 DIAGNOSIS — F320 Major depressive disorder, single episode, mild: Secondary | ICD-10-CM

## 2021-03-23 DIAGNOSIS — E559 Vitamin D deficiency, unspecified: Secondary | ICD-10-CM

## 2021-03-23 DIAGNOSIS — Z2821 Immunization not carried out because of patient refusal: Secondary | ICD-10-CM | POA: Diagnosis not present

## 2021-03-23 DIAGNOSIS — I10 Essential (primary) hypertension: Secondary | ICD-10-CM | POA: Diagnosis not present

## 2021-03-23 NOTE — Assessment & Plan Note (Signed)
History of  Vitamin d deff. Labs ordered.  Marland Kitchen

## 2021-03-23 NOTE — Assessment & Plan Note (Signed)
Takes tylenol as needed. Get adequate sleep referral to neurology due to having fainting spells.

## 2021-03-23 NOTE — Assessment & Plan Note (Signed)
Pt educated on the need for vaccine , she verbalized understanding and refused vaccines today

## 2021-03-23 NOTE — Assessment & Plan Note (Deleted)
Pt referred to neurology to rule out neurological causes for her symptoms.  °Labs ordered.. PT has family history of seizures °EKG obtained today shows SR.  °Pt told to call the EMS if she continues to have symptoms before getting to see the neurology, they verbalise understanding.  ° °

## 2021-03-23 NOTE — Assessment & Plan Note (Signed)
Pt referred to neurology to rule out neurological causes for her symptoms.  Labs ordered.. PT has family history of seizures EKG obtained today shows SR.  Pt told to call the EMS if she continues to have symptoms before getting to see the neurology, they verbalise understanding.

## 2021-03-23 NOTE — Patient Instructions (Addendum)
Please get your shingles vaccine, TDAP vaccines at your pharmacy Get your fasting labs done tomorrow You have been referred to neurology for your fainting spells.   It is important that you exercise regularly at least 30 minutes 5 times a week.  Think about what you will eat, plan ahead. Choose " clean, green, fresh or frozen" over canned, processed or packaged foods which are more sugary, salty and fatty. 70 to 75% of food eaten should be vegetables and fruit. Three meals at set times with snacks allowed between meals, but they must be fruit or vegetables. Aim to eat over a 12 hour period , example 7 am to 7 pm, and STOP after  your last meal of the day. Drink water,generally about 64 ounces per day, no other drink is as healthy. Fruit juice is best enjoyed in a healthy way, by EATING the fruit.  Thanks for choosing [facility], we consider it a privelige to serve you.

## 2021-03-23 NOTE — Assessment & Plan Note (Signed)
Well controlled takes lexapro 20mg  daily.  Denies SI, HI

## 2021-03-23 NOTE — Assessment & Plan Note (Signed)
Importance of healthy food choices with portion control discussed as well as eating regularly within 12  hour window.   The need to choose clean green food 50%-75% of time is discussed as well as make water the primary drink and set a goal for 64 ounces daily.  Patient reeducated about the importance of committment to minimum of 150 minutes of exercise per week.  Three meals at set times with snacks allowed between meals but they must be fruit or vegetable.   Aim to eat  over 12 hour period  for example 7 am to 7 pm. Stop after your last meal of the day.  Wt Readings from Last 3 Encounters:  03/23/21 (!) 351 lb 1.9 oz (159.3 kg)  12/16/20 (!) 330 lb (149.7 kg)  10/06/20 (!) 357 lb (161.9 kg)

## 2021-03-23 NOTE — Progress Notes (Signed)
° °  Doris Miller     MRN: [id number]      DOB: [birth year]   HPI Doris Miller is here for c/o passing out twice,she was unconscious, her breathing was diminished, family had to take a damp cloth and wash her face before she came out of her fating spell.  She is having new headaches that started 3 days ago , HA is 8/10 going to bed and sleeping helps her headache. Denies nausea, vomiting , changes in her vision numbness , tingling . Pt has family history of epilepsy.     ROS Denies recent fever or chills. Denies sinus pressure, nasal congestion, ear pain or sore throat. Denies chest congestion, productive cough or wheezing. Denies chest pains, palpitations and leg swelling Denies abdominal pain, nausea, vomiting,diarrhea or constipation.   Denies dysuria, frequency, hesitancy or incontinence. Has limitation in mobility, no c/o joint pain  Has  headaches,fainting spells,  denies numbness, or tingling.    PE  BP 131/66 (BP Location: Left Arm, Patient Position: Sitting, Cuff Size: Large)    Pulse (!) 104    Ht 5\' 5"  (1.651 m)    Wt (!) 351 lb 1.9 oz (159.3 kg)    LMP 06/13/2011    SpO2 99%    BMI 58.43 kg/m   Patient alert and oriented and in no cardiopulmonary distress.  HEENT: No facial asymmetry, EOMI,     Neck supple .  Chest: Clear to auscultation bilaterally.  CVS: S1, S2 no murmurs, no S3.Regular rate.  ABD: Soft non tender.   Ext: No edema  MS: limited ROM spine, shoulders, hips and knees, sitting in a wheelchair today   Psych: Good eye contact, normal affect. Memory intact not anxious or depressed appearing.  CNS: CN 2-12 intact, power,  normal throughout.no focal deficits noted.   Assessment & Plan

## 2021-03-23 NOTE — Assessment & Plan Note (Signed)
DASH diet and commitment to daily physical activity for a minimum of 30 minutes discussed and encouraged, as a part of hypertension management. The importance of attaining a healthy weight is also discussed.  BP/Weight 03/23/2021 12/16/2020 10/06/2020 09/18/2020 09/08/2020 08/25/2020 5/39/6728  Systolic BP 979 90 150 413 [phone]  Diastolic BP 66 68 84 79 81 77 89  Wt. (Lbs) 351.12 330 357 353 332 322 337  BMI 58.43 54.91 59.41 58.74 55.25 53.58 56.08  EGFR today

## 2021-03-24 ENCOUNTER — Other Ambulatory Visit: Payer: Self-pay | Admitting: Internal Medicine

## 2021-03-24 DIAGNOSIS — E559 Vitamin D deficiency, unspecified: Secondary | ICD-10-CM | POA: Diagnosis not present

## 2021-03-24 DIAGNOSIS — I10 Essential (primary) hypertension: Secondary | ICD-10-CM | POA: Diagnosis not present

## 2021-03-24 DIAGNOSIS — Z9189 Other specified personal risk factors, not elsewhere classified: Secondary | ICD-10-CM | POA: Diagnosis not present

## 2021-03-26 ENCOUNTER — Other Ambulatory Visit: Payer: Self-pay | Admitting: Nurse Practitioner

## 2021-03-26 DIAGNOSIS — E559 Vitamin D deficiency, unspecified: Secondary | ICD-10-CM

## 2021-03-26 DIAGNOSIS — R7989 Other specified abnormal findings of blood chemistry: Secondary | ICD-10-CM

## 2021-03-26 LAB — CBC WITH DIFFERENTIAL/PLATELET
Basophils Absolute: 0 10*3/uL (ref 0.0–0.2)
Basos: 1 %
EOS (ABSOLUTE): 0.1 10*3/uL (ref 0.0–0.4)
Eos: 2 %
Hematocrit: 40.8 % (ref 34.0–46.6)
Hemoglobin: 13.2 g/dL (ref 11.1–15.9)
Immature Grans (Abs): 0 10*3/uL (ref 0.0–0.1)
Immature Granulocytes: 0 %
Lymphocytes Absolute: 2.1 10*3/uL (ref 0.7–3.1)
Lymphs: 34 %
MCH: 26.8 pg (ref 26.6–33.0)
MCHC: 32.4 g/dL (ref 31.5–35.7)
MCV: 83 fL (ref 79–97)
Monocytes Absolute: 0.4 10*3/uL (ref 0.1–0.9)
Monocytes: 6 %
Neutrophils Absolute: 3.5 10*3/uL (ref 1.4–7.0)
Neutrophils: 57 %
Platelets: 339 10*3/uL (ref 150–450)
RBC: 4.92 x10E6/uL (ref 3.77–5.28)
RDW: 15 % (ref 11.7–15.4)
WBC: 6.1 10*3/uL (ref 3.4–10.8)

## 2021-03-26 LAB — LIPID PANEL
Chol/HDL Ratio: 2.8 ratio (ref 0.0–4.4)
Cholesterol, Total: 163 mg/dL (ref 100–199)
HDL: 58 mg/dL (ref >39)
LDL Chol Calc (NIH): 84 mg/dL (ref 0–99)
Triglycerides: 116 mg/dL (ref 0–149)
VLDL Cholesterol Cal: 21 mg/dL (ref 5–40)

## 2021-03-26 LAB — CMP14+EGFR
ALT: 11 IU/L (ref 0–32)
AST: 12 IU/L (ref 0–40)
Albumin/Globulin Ratio: 1.5 (ref 1.2–2.2)
Albumin: 3.8 g/dL (ref 3.8–4.9)
Alkaline Phosphatase: 82 IU/L (ref 44–121)
BUN/Creatinine Ratio: 15 (ref 9–23)
BUN: 14 mg/dL (ref 6–24)
Bilirubin Total: 0.3 mg/dL (ref 0.0–1.2)
CO2: 20 mmol/L (ref 20–29)
Calcium: 9.3 mg/dL (ref 8.7–10.2)
Chloride: 101 mmol/L (ref 96–106)
Creatinine, Ser: 0.91 mg/dL (ref 0.57–1.00)
Globulin, Total: 2.5 g/dL (ref 1.5–4.5)
Glucose: 93 mg/dL (ref 70–99)
Potassium: 3.8 mmol/L (ref 3.5–5.2)
Sodium: 139 mmol/L (ref 134–144)
Total Protein: 6.3 g/dL (ref 6.0–8.5)
eGFR: 73 mL/min/{1.73_m2} (ref >59)

## 2021-03-26 LAB — VITAMIN D 25 HYDROXY (VIT D DEFICIENCY, FRACTURES): Vit D, 25-Hydroxy: 28.1 ng/mL — ABNORMAL LOW (ref 30.0–100.0)

## 2021-03-26 LAB — TSH: TSH: 0.384 u[IU]/mL — ABNORMAL LOW (ref 0.450–4.500)

## 2021-03-26 LAB — SPECIMEN STATUS REPORT

## 2021-03-26 MED ORDER — VITAMIN D3 25 MCG (1000 UT) PO CAPS: 1000.0000 [IU] | ORAL_CAPSULE | Freq: Every day | ORAL | 3 refills | Status: DC

## 2021-03-26 NOTE — Progress Notes (Signed)
Low TSH, I have referred her to endo.  Take vitamin d 1000 units daily for vitamin d deficiency.

## 2021-03-30 NOTE — Progress Notes (Signed)
Can you please find out the status of the Free T4 lab that was ordered? Thanks

## 2021-03-31 ENCOUNTER — Telehealth: Payer: Self-pay | Admitting: Nurse Practitioner

## 2021-03-31 NOTE — Telephone Encounter (Signed)
Pt called in regard to lab results

## 2021-04-01 NOTE — Telephone Encounter (Signed)
As of yesterday t4 is still pending per [place] and will result in chart when available

## 2021-04-02 LAB — T4, FREE

## 2021-04-02 LAB — SPECIMEN STATUS REPORT

## 2021-04-02 NOTE — Telephone Encounter (Signed)
Pt notified that we are awaiting results. Pt verbalized understanding.

## 2021-04-05 ENCOUNTER — Ambulatory Visit (INDEPENDENT_AMBULATORY_CARE_PROVIDER_SITE_OTHER): Payer: Medicaid Other | Admitting: Nurse Practitioner

## 2021-04-05 ENCOUNTER — Encounter: Payer: Self-pay | Admitting: Nurse Practitioner

## 2021-04-05 ENCOUNTER — Other Ambulatory Visit: Payer: Self-pay

## 2021-04-05 VITALS — BP 107/69 | HR 90 | Ht 62.5 in | Wt 352.6 lb

## 2021-04-05 DIAGNOSIS — R7989 Other specified abnormal findings of blood chemistry: Secondary | ICD-10-CM

## 2021-04-05 NOTE — Progress Notes (Signed)
04/05/2021     Endocrinology Consult Note    Subjective:    Patient ID: Doris Miller, female    DOB: 1960-02-28, PCP Donell Beers, FNP.   Past Medical History:  Diagnosis Date   Arthritis    Bronchospasm 03/12/2019   Depression    Encounter for hepatitis C screening test for low risk patient 03/12/2019   Encounter for screening for HIV 03/12/2019   Encounter for screening for malignant neoplasm of colon 03/12/2019   Encounter for screening mammogram for malignant neoplasm of breast 03/12/2019   GERD (gastroesophageal reflux disease)    Hypertension    Lymphedema    OA (osteoarthritis) of knee right     Past Surgical History:  Procedure Laterality Date   CHOLECYSTECTOMY     cyst removal left wrist Left    TUBAL LIGATION      Social History   Socioeconomic History   Marital status: Married    Spouse name: Not on file   Number of children: 3   Years of education: Not on file   Highest education level: Not on file  Occupational History   Not on file  Tobacco Use   Smoking status: Never    Passive exposure: Past   Smokeless tobacco: Never  Vaping Use   Vaping Use: Never used  Substance and Sexual Activity   Alcohol use: No   Drug use: No   Sexual activity: Yes    Birth control/protection: Surgical  Other Topics Concern   Not on file  Social History Narrative   Lives with husband live together (3 children everyone lives close together)   Cat-Cricket       Enjoys crafting-with daughter in [employer], therapeutic, puzzles books       Diet: more snacking-not really eating meals    Caffeine: Dr Reino Kent- not sugar free   Water: 1-3 cups      Does not wears seat belt -smootherly feeling    Smoke and carbon monoxide detectors    Does not use phone while driving             Social Determinants of Corporate investment banker Strain: Not on file  Food Insecurity: Not on file  Transportation Needs: Not on file  Physical Activity: Not on file  Stress: Not on  file  Social Connections: Not on file    Family History  Problem Relation Age of Onset   CAD Mother    Breast cancer Mother    Endometrial cancer Mother    Seizures Mother    CAD Father    Emphysema Father    COPD Father    Seizures Brother    Epilepsy Son     Outpatient Encounter Medications as of 04/05/2021  Medication Sig   albuterol (VENTOLIN HFA) 108 (90 Base) MCG/ACT inhaler Inhale 1-2 puffs into the lungs every 6 (six) hours as needed for wheezing or shortness of breath.   Cholecalciferol (VITAMIN D3) 25 MCG (1000 UT) CAPS Take 1 capsule (1,000 Units total) by mouth daily.   escitalopram (LEXAPRO) 20 MG tablet Take 1 tablet by mouth once daily   lisinopril-hydrochlorothiazide (ZESTORETIC) 10-12.5 MG tablet Take 1 tablet by mouth once daily   omeprazole (PRILOSEC) 20 MG capsule Take 1 capsule by mouth once daily   meloxicam (MOBIC) 7.5 MG tablet Take 1 tablet (7.5 mg total) by mouth daily. (Patient not taking: Reported on 03/23/2021)   No facility-administered encounter medications on file as of 04/05/2021.  ALLERGIES: Allergies  Allergen Reactions   Codeine Shortness Of Breath   Diclofenac Rash    VACCINATION STATUS:  There is no immunization history on file for this patient.   HPI  Doris Miller is 61 y.o. female who presents today, accompanied by her granddaughter, with a medical history as above. she is being seen in consultation for hyperthyroidism requested by Donell Beers, FNP.  she has been dealing with symptoms of diarrhea, insomnia, anxiety, and palpitations intermittently for several years.   her most recent thyroid labs revealed marginally suppressed TSH of 0.384 on 03/24/21 (no other thyroid labs checked).  She does have similar lab findings dating back to 2010.  she denies dysphagia, choking, shortness of breath, no recent voice change.    she denies family history of thyroid dysfunction and denies family hx of thyroid cancer. she denies  personal history of goiter. she is not on any anti-thyroid medications nor on any thyroid hormone supplements. Denies use of Biotin containing supplements. She has never had any imaging of her thyroid in the past. she is willing to proceed with appropriate work up.   Review of systems  Constitutional: + Minimally fluctuating body weight, current Body mass index is 63.46 kg/m., no fatigue, no subjective hyperthermia, no subjective hypothermia Eyes: no blurry vision, no xerophthalmia ENT: no sore throat, no nodules palpated in throat, no dysphagia/odynophagia, no hoarseness Cardiovascular: no chest pain, no shortness of breath, + intermittent palpitations, no leg swelling Respiratory: no cough, no shortness of breath Gastrointestinal: no nausea/vomiting, intermittent diarrhea Musculoskeletal: no muscle/joint aches Skin: no rashes, no hyperemia Neurological: no tremors, no numbness, no tingling, no dizziness Psychiatric: no depression, + anxiety, + insomnia   Objective:    BP 107/69    Pulse 90    Ht 5' 2.5" (1.588 m)    Wt (!) 352 lb 9.6 oz (159.9 kg)    LMP 06/13/2011    SpO2 98%    BMI 63.46 kg/m   Wt Readings from Last 3 Encounters:  04/05/21 (!) 352 lb 9.6 oz (159.9 kg)  03/23/21 (!) 351 lb 1.9 oz (159.3 kg)  12/16/20 (!) 330 lb (149.7 kg)     BP Readings from Last 3 Encounters:  04/05/21 107/69  03/23/21 131/66  12/16/20 90/68                          Physical Exam- Limited  Constitutional:  Body mass index is 63.46 kg/m. , not in acute distress, normal state of mind Eyes:  EOMI, no exophthalmos Neck: Supple Thyroid: No gross goiter Cardiovascular: RRR, no murmurs, rubs, or gallops, no edema Respiratory: Adequate breathing efforts, no crackles, rales, rhonchi, or wheezing Musculoskeletal: no gross deformities, strength intact in all four extremities, no gross restriction of joint movements Skin:  no rashes, no hyperemia Neurological: slight tremor with outstretched  hands L>R   CMP     Component Value Date/Time   NA 139 03/24/2021 0000   K 3.8 03/24/2021 0000   CL 101 03/24/2021 0000   CO2 20 03/24/2021 0000   GLUCOSE 93 03/24/2021 0000   GLUCOSE 99 03/12/2019 1113   BUN 14 03/24/2021 0000   CREATININE 0.91 03/24/2021 0000   CREATININE 0.95 03/12/2019 1113   CALCIUM 9.3 03/24/2021 0000   PROT 6.3 03/24/2021 0000   ALBUMIN 3.8 03/24/2021 0000   AST 12 03/24/2021 0000   ALT 11 03/24/2021 0000   ALKPHOS 82 03/24/2021 0000   BILITOT 0.3  03/24/2021 0000   GFRNONAA 67 03/12/2019 1113   GFRAA 78 03/12/2019 1113     CBC    Component Value Date/Time   WBC 6.1 03/24/2021 0000   WBC 4.8 03/12/2019 1113   RBC 4.92 03/24/2021 0000   RBC 5.03 03/12/2019 1113   HGB 13.2 03/24/2021 0000   HCT 40.8 03/24/2021 0000   PLT 339 03/24/2021 0000   MCV 83 03/24/2021 0000   MCH 26.8 03/24/2021 0000   MCH 27.2 03/12/2019 1113   MCHC 32.4 03/24/2021 0000   MCHC 32.3 03/12/2019 1113   RDW 15.0 03/24/2021 0000   LYMPHSABS 2.1 03/24/2021 0000   MONOABS 0.5 10/25/2013 1518   EOSABS 0.1 03/24/2021 0000   BASOSABS 0.0 03/24/2021 0000     Diabetic Labs (most recent): Lab Results  Component Value Date   HGBA1C 5.6 08/25/2020   HGBA1C 5.5 03/12/2019    Lipid Panel     Component Value Date/Time   CHOL 163 03/24/2021 0000   TRIG 116 03/24/2021 0000   HDL 58 03/24/2021 0000   CHOLHDL 2.8 03/24/2021 0000   CHOLHDL 2.7 03/12/2019 1113   LDLCALC 84 03/24/2021 0000   LDLCALC 88 03/12/2019 1113   LABVLDL 21 03/24/2021 0000        Latest Reference Range & Units 09/08/08 02:45 03/12/19 11:13 03/20/19 00:00 08/25/20 14:38 03/24/21 00:00  TSH 0.450 - 4.500 uIU/mL 0.333 Test methodology is 3rd generation TSH (L) 0.34 (L) 0.57 0.704 0.384 (L)  Triiodothyronine,Free,Serum 2.3 - 4.2 pg/mL   3.3    T4,Free(Direct) ng/dL   1.2  CANCELED  (L): Data is abnormally low    Assessment & Plan:   1. Abnormal TSH  she is being seen at a kind request of  Paseda, Baird Kay, FNP.  her history and most recent labs are reviewed, and she was examined clinically. Subjective and objective findings are inconsistent with thyrotoxicosis from primary hyperthyroidism. More information is needed to identify any possible thyroid dysfunction.  However, the potential risks of untreated thyrotoxicosis has been discussed in detail with her, and she agrees to proceed with diagnostic workup and treatment plan.   I will repeat full profile thyroid function tests today including antibody testing to help rule out autoimmune thyroid dysfunction.  Will hold off on any additional imaging given her lack of goiter and relatively stable labs.      she will return in 1 week for treatment decision.  I did not initiate any new prescriptions at today's visit.    -Patient is advised to maintain close follow up with Donell Beers, FNP for primary care needs.   - Time spent with the patient: 45 minutes, of which >50% was spent in obtaining information about her symptoms, reviewing her previous labs, evaluations, and treatments, counseling her about her hyperthyroidism , and developing a plan to confirm the diagnosis and long term treatment as necessary. Please refer to "Patient Self Inventory" in the Media tab for reviewed elements of pertinent patient history.  Doris Miller participated in the discussions, expressed understanding, and voiced agreement with the above plans.  All questions were answered to her satisfaction. she is encouraged to contact clinic should she have any questions or concerns prior to her return visit.   Follow up plan: Return in about 1 week (around 04/12/2021) for Thyroid follow up, Previsit labs.   Thank you for involving me in the care of this pleasant patient, and I will continue to update you with her progress.  Ronny BaconWhitney Lamoine Fredricksen, [facility] DesotoFNP-BC [facility] [address] [place], [place] [address] Phone:  [phone] Fax: [phone]  04/05/2021, 10:07 AM

## 2021-04-05 NOTE — Patient Instructions (Signed)
Thyroid-Stimulating Hormone Test Why am I having this test? The thyroid is a gland in the lower front of the neck. It makes hormones that affect many body parts and systems, including the system that affects how quickly the body burns fuel for energy (metabolism). The pituitary gland is located just below the brain, behind the eyes and nasal passages. It helps maintain thyroid hormone levels and thyroid gland function. You may have a thyroid-stimulating hormone (TSH) test if you have possible symptoms of abnormal thyroid hormone levels. This test can help your health care provider: Diagnose a disorder of the thyroid gland or pituitary gland. Manage your condition and treatment if you have an underactive thyroid (hypothyroidism) or an overactive thyroid (hyperthyroidism). Newborn babies may have this test done to screen for hypothyroidism that is present at birth (congenital). What is being tested? This test measures the amount of TSH in your blood. TSH may also be called thyrotropin. When the thyroid does not make enough hormones, the pituitary gland releases TSH into the bloodstream to stimulate the thyroid gland to make more hormones. What kind of sample is taken?   A blood sample is required for this test. It is usually collected by inserting a needle into a blood vessel. For newborns, a small amount of blood may be collected from the umbilical cord, or by using a small needle to prick the baby's heel (heel stick). Tell a health care provider about: All medicines you are taking, including vitamins, herbs, eye drops, creams, and over-the-counter medicines. Any blood disorders you have. Any surgeries you have had. Any medical conditions you have. Whether you are pregnant or may be pregnant. How are the results reported? Your test results will be reported as a value that indicates how much TSH is in your blood. Your health care provider will compare your results to normal ranges that were  established after testing a large group of people (reference ranges). Reference ranges may vary among labs and hospitals. For this test, common reference ranges are: Adult: 2-10 microunits/mL or 2-10 milliunits/L. Newborn: Heel stick: 3-18 microunits/mL or 3-18 milliunits/L. Umbilical cord: 3-12 microunits/mL or 3-12 milliunits/L. What do the results mean? Results that are within the reference range are considered normal. This means that you have a normal amount of TSH in your blood. Results that are higher than the reference range mean that your TSH levels are too high. This may mean: Your thyroid gland is not making enough thyroid hormones. Your thyroid medicine dosage is too low. You have a tumor on your pituitary gland. This is rare. Results that are lower than the reference range mean that your TSH levels are too low. This may be caused by hyperthyroidism or by a problem with the pituitary gland function. Talk with your health care provider about what your results mean. Questions to ask your health care provider Ask your health care provider, or the department that is doing the test: When will my results be ready? How will I get my results? What are my treatment options? What other tests do I need? What are my next steps? Summary You may have a thyroid-stimulating hormone (TSH) test if you have possible symptoms of abnormal thyroid hormone levels. The thyroid is a gland in the lower front of the neck. It makes hormones that affect many body parts and systems. The pituitary gland is located just below the brain, behind the eyes and nasal passages. It helps maintain thyroid hormone levels and thyroid gland function. This test measures the   amount of TSH in your blood. TSH is made by the pituitary gland. It may also be called thyrotropin. This information is not intended to replace advice given to you by your health care provider. Make sure you discuss any questions you have with your  health care provider. Document Revised: 10/08/2020 Document Reviewed: 10/17/2019 Elsevier Patient Education  2022 Elsevier Inc.  

## 2021-04-08 DIAGNOSIS — R7989 Other specified abnormal findings of blood chemistry: Secondary | ICD-10-CM | POA: Diagnosis not present

## 2021-04-09 ENCOUNTER — Encounter: Payer: Self-pay | Admitting: Nurse Practitioner

## 2021-04-09 ENCOUNTER — Other Ambulatory Visit: Payer: Self-pay

## 2021-04-09 ENCOUNTER — Ambulatory Visit: Payer: Medicaid Other | Admitting: Nurse Practitioner

## 2021-04-09 DIAGNOSIS — L03116 Cellulitis of left lower limb: Secondary | ICD-10-CM

## 2021-04-09 DIAGNOSIS — K219 Gastro-esophageal reflux disease without esophagitis: Secondary | ICD-10-CM | POA: Diagnosis not present

## 2021-04-09 MED ORDER — OMEPRAZOLE 20 MG PO CPDR: 20.0000 mg | DELAYED_RELEASE_CAPSULE | Freq: Every day | ORAL | 0 refills | Status: DC

## 2021-04-09 MED ORDER — SULFAMETHOXAZOLE-TRIMETHOPRIM 800-160 MG PO TABS: 1.0000 | ORAL_TABLET | Freq: Two times a day (BID) | ORAL | 0 refills | Status: DC

## 2021-04-09 NOTE — Patient Instructions (Addendum)
Take bactrim DS tablet, 1 tablet by mouth 2 (two) times daily for your cellulitis.    It is important that you exercise regularly at least 30 minutes 5 times a week.  Think about what you will eat, plan ahead. Choose " clean, green, fresh or frozen" over canned, processed or packaged foods which are more sugary, salty and fatty. 70 to 75% of food eaten should be vegetables and fruit. Three meals at set times with snacks allowed between meals, but they must be fruit or vegetables. Aim to eat over a 12 hour period , example 7 am to 7 pm, and STOP after  your last meal of the day. Drink water,generally about 64 ounces per day, no other drink is as healthy. Fruit juice is best enjoyed in a healthy way, by EATING the fruit.  Thanks for choosing [facility], we consider it a privelige to serve you.

## 2021-04-09 NOTE — Progress Notes (Signed)
° °  ENVY MENO     MRN: [id number]      DOB: 23-Jan-1961   HPI Doris Miller medical history of hypertension, GERD, morbid obesity, osteoarthritis of the knee is here for complaints of left leg swelling, soreness redness that started 2 days ago. States that she has a wound with drainage on her left leg, pt denies trauma, numbness, tingling, of her lower extremities.     ROS Denies recent fever or chills. Denies sinus pressure, nasal congestion, ear pain or sore throat. Denies chest congestion, productive cough or wheezing. Denies chest pains, palpitations  Denies abdominal pain, nausea, vomiting,diarrhea or constipation.   Denies headaches, seizures, numbness, or tingling.    PE  BP 132/79 (BP Location: Right Arm, Patient Position: Sitting, Cuff Size: Large)    Pulse (!) 101    Ht 5\' 3"  (1.6 m)    Wt (!) 350 lb (158.8 kg)    LMP 06/13/2011    SpO2 96%    BMI 62.00 kg/m   Patient alert and oriented and in no cardiopulmonary distress.  Chest: Clear to auscultation bilaterally.  CVS: S1, S2 no murmurs, no S3.Regular rate.  ABD: Soft non tender, obese  EXT,  trace edema of bilateral lower extremities   Skin:skin erythema bilateral legs, small non purulent drainage  noted on  distal left leg .  Psych: Good eye contact, normal affect. Memory intact not anxious or depressed appearing.  CNS: CN 2-12 intact, power,  normal throughout.no focal deficits noted.   Assessment & Plan

## 2021-04-10 LAB — TSH: TSH: 0.425 u[IU]/mL — ABNORMAL LOW (ref 0.450–4.500)

## 2021-04-10 LAB — T4, FREE: Free T4: 1.29 ng/dL (ref 0.82–1.77)

## 2021-04-10 LAB — T3, FREE: T3, Free: 2.8 pg/mL (ref 2.0–4.4)

## 2021-04-10 LAB — THYROGLOBULIN ANTIBODY: Thyroglobulin Antibody: <1 IU/mL (ref 0.0–0.9)

## 2021-04-10 LAB — THYROID PEROXIDASE ANTIBODY: Thyroperoxidase Ab SerPl-aCnc: 14 IU/mL (ref 0–34)

## 2021-04-10 NOTE — Assessment & Plan Note (Signed)
Condition well-controlled Takes omeprazole 20 mg daily today Med refilled.

## 2021-04-10 NOTE — Assessment & Plan Note (Signed)
Bactrim ds 800-160mg  tablet, Take 1 tablet by mouth 2 (two) times daily for 7 days. Patient told to keep both lower extremity clean and dry. Keep both legs elevated when sitting to help decrease swelling. Follow-up in 4 weeks

## 2021-04-12 ENCOUNTER — Ambulatory Visit: Payer: Medicaid Other | Admitting: Nurse Practitioner

## 2021-04-12 ENCOUNTER — Other Ambulatory Visit: Payer: Self-pay | Admitting: Internal Medicine

## 2021-04-12 DIAGNOSIS — F320 Major depressive disorder, single episode, mild: Secondary | ICD-10-CM

## 2021-04-16 ENCOUNTER — Ambulatory Visit: Payer: Medicaid Other | Admitting: Nurse Practitioner

## 2021-04-23 ENCOUNTER — Encounter: Payer: Self-pay | Admitting: Nurse Practitioner

## 2021-04-23 ENCOUNTER — Other Ambulatory Visit: Payer: Self-pay

## 2021-04-23 ENCOUNTER — Ambulatory Visit (INDEPENDENT_AMBULATORY_CARE_PROVIDER_SITE_OTHER): Payer: Medicaid Other | Admitting: Nurse Practitioner

## 2021-04-23 VITALS — BP 121/78 | HR 84 | Ht 63.0 in | Wt 350.4 lb

## 2021-04-23 DIAGNOSIS — R7989 Other specified abnormal findings of blood chemistry: Secondary | ICD-10-CM | POA: Diagnosis not present

## 2021-04-23 NOTE — Progress Notes (Signed)
04/23/2021     Endocrinology Follow Up Note    Subjective:    Patient ID: Doris Miller, female    DOB: 11-06-1960, PCP Donell Beers, FNP.   Past Medical History:  Diagnosis Date   Arthritis    Bronchospasm 03/12/2019   Depression    Encounter for hepatitis C screening test for low risk patient 03/12/2019   Encounter for screening for HIV 03/12/2019   Encounter for screening for malignant neoplasm of colon 03/12/2019   Encounter for screening mammogram for malignant neoplasm of breast 03/12/2019   GERD (gastroesophageal reflux disease)    Hypertension    Lymphedema    OA (osteoarthritis) of knee right     Past Surgical History:  Procedure Laterality Date   CHOLECYSTECTOMY     cyst removal left wrist Left    TUBAL LIGATION      Social History   Socioeconomic History   Marital status: Married    Spouse name: Not on file   Number of children: 3   Years of education: Not on file   Highest education level: Not on file  Occupational History   Not on file  Tobacco Use   Smoking status: Never    Passive exposure: Past   Smokeless tobacco: Never  Vaping Use   Vaping Use: Never used  Substance and Sexual Activity   Alcohol use: No   Drug use: No   Sexual activity: Yes    Birth control/protection: Surgical  Other Topics Concern   Not on file  Social History Narrative   Lives with husband live together (3 children everyone lives close together)   Cat-Cricket       Enjoys crafting-with daughter in [employer], therapeutic, puzzles books       Diet: more snacking-not really eating meals    Caffeine: Dr Reino Kent- not sugar free   Water: 1-3 cups      Does not wears seat belt -smootherly feeling    Smoke and carbon monoxide detectors    Does not use phone while driving             Social Determinants of Corporate investment banker Strain: Not on file  Food Insecurity: Not on file  Transportation Needs: Not on file  Physical Activity: Not on file  Stress: Not on  file  Social Connections: Not on file    Family History  Problem Relation Age of Onset   CAD Mother    Breast cancer Mother    Endometrial cancer Mother    Seizures Mother    CAD Father    Emphysema Father    COPD Father    Seizures Brother    Epilepsy Son     Outpatient Encounter Medications as of 04/23/2021  Medication Sig   albuterol (VENTOLIN HFA) 108 (90 Base) MCG/ACT inhaler Inhale 1-2 puffs into the lungs every 6 (six) hours as needed for wheezing or shortness of breath.   Cholecalciferol (VITAMIN D3) 25 MCG (1000 UT) CAPS Take 1 capsule (1,000 Units total) by mouth daily.   escitalopram (LEXAPRO) 20 MG tablet Take 1 tablet by mouth once daily   lisinopril-hydrochlorothiazide (ZESTORETIC) 10-12.5 MG tablet Take 1 tablet by mouth once daily   meloxicam (MOBIC) 7.5 MG tablet Take 1 tablet (7.5 mg total) by mouth daily.   omeprazole (PRILOSEC) 20 MG capsule Take 1 capsule (20 mg total) by mouth daily.   [DISCONTINUED] sulfamethoxazole-trimethoprim (BACTRIM DS) 800-160 MG tablet Take 1 tablet by mouth 2 (  two) times daily. (Patient not taking: Reported on 04/23/2021)   No facility-administered encounter medications on file as of 04/23/2021.    ALLERGIES: Allergies  Allergen Reactions   Codeine Shortness Of Breath   Diclofenac Rash    VACCINATION STATUS:  There is no immunization history on file for this patient.   HPI  Doris Miller is 61 y.o. female who presents today, accompanied by her granddaughter, with a medical history as above. she is being seen in follow up after being seen in consultation for hyperthyroidism requested by Donell Beers, FNP.  she has been dealing with symptoms of diarrhea, insomnia, anxiety, and palpitations intermittently for several years.   her most recent thyroid labs revealed marginally suppressed TSH of 0.384 on 03/24/21 (no other thyroid labs checked).  She does have similar lab findings dating back to 2010.  she denies dysphagia,  choking, shortness of breath, no recent voice change.    she denies family history of thyroid dysfunction and denies family hx of thyroid cancer. she denies personal history of goiter. she is not on any anti-thyroid medications nor on any thyroid hormone supplements. Denies use of Biotin containing supplements. She has never had any imaging of her thyroid in the past. she is willing to proceed with appropriate work up.   Review of systems  Constitutional: + Minimally fluctuating body weight, current Body mass index is 62.07 kg/m., no fatigue, no subjective hyperthermia, no subjective hypothermia Eyes: no blurry vision, no xerophthalmia ENT: no sore throat, no nodules palpated in throat, no dysphagia/odynophagia, no hoarseness Cardiovascular: no chest pain, no shortness of breath, + intermittent palpitations-continued, no leg swelling Respiratory: no cough, no shortness of breath Gastrointestinal: no nausea/vomiting, intermittent diarrhea-continued Musculoskeletal: no muscle/joint aches Skin: no rashes, no hyperemia Neurological: no tremors, no numbness, no tingling, no dizziness Psychiatric: no depression, + anxiety, + insomnia- continued   Objective:    BP 121/78    Pulse 84    Ht  (1.6 m)    Wt (!) 350 lb 6.4 oz (158.9 kg)    LMP 06/13/2011    SpO2 98%    BMI 62.07 kg/m   Wt Readings from Last 3 Encounters:  04/23/21 (!) 350 lb 6.4 oz (158.9 kg)  04/09/21 (!) 350 lb (158.8 kg)  04/05/21 (!) 352 lb 9.6 oz (159.9 kg)     BP Readings from Last 3 Encounters:  04/23/21 121/78  04/09/21 132/79  04/05/21 107/69                          Physical Exam- Limited  Constitutional:  Body mass index is 62.07 kg/m. , not in acute distress, normal state of mind Eyes:  EOMI, no exophthalmos Neck: Supple Thyroid: No gross goiter Cardiovascular: RRR, no murmurs, rubs, or gallops, no edema Respiratory: Adequate breathing efforts, no crackles, rales, rhonchi, or  wheezing Musculoskeletal: no gross deformities, strength intact in all four extremities, no gross restriction of joint movements Skin:  no rashes, no hyperemia Neurological: slight tremor with outstretched hands L>R   CMP     Component Value Date/Time   NA 139 03/24/2021 0000   K 3.8 03/24/2021 0000   CL 101 03/24/2021 0000   CO2 20 03/24/2021 0000   GLUCOSE 93 03/24/2021 0000   GLUCOSE 99 03/12/2019 1113   BUN 14 03/24/2021 0000   CREATININE 0.91 03/24/2021 0000   CREATININE 0.95 03/12/2019 1113   CALCIUM 9.3 03/24/2021 0000   PROT 6.3  03/24/2021 0000   ALBUMIN 3.8 03/24/2021 0000   AST 12 03/24/2021 0000   ALT 11 03/24/2021 0000   ALKPHOS 82 03/24/2021 0000   BILITOT 0.3 03/24/2021 0000   GFRNONAA 67 03/12/2019 1113   GFRAA 78 03/12/2019 1113     CBC    Component Value Date/Time   WBC 6.1 03/24/2021 0000   WBC 4.8 03/12/2019 1113   RBC 4.92 03/24/2021 0000   RBC 5.03 03/12/2019 1113   HGB 13.2 03/24/2021 0000   HCT 40.8 03/24/2021 0000   PLT 339 03/24/2021 0000   MCV 83 03/24/2021 0000   MCH 26.8 03/24/2021 0000   MCH 27.2 03/12/2019 1113   MCHC 32.4 03/24/2021 0000   MCHC 32.3 03/12/2019 1113   RDW 15.0 03/24/2021 0000   LYMPHSABS 2.1 03/24/2021 0000   MONOABS 0.5 10/25/2013 1518   EOSABS 0.1 03/24/2021 0000   BASOSABS 0.0 03/24/2021 0000     Diabetic Labs (most recent): Lab Results  Component Value Date   HGBA1C 5.6 08/25/2020   HGBA1C 5.5 03/12/2019    Lipid Panel     Component Value Date/Time   CHOL 163 03/24/2021 0000   TRIG 116 03/24/2021 0000   HDL 58 03/24/2021 0000   CHOLHDL 2.8 03/24/2021 0000   CHOLHDL 2.7 03/12/2019 1113   LDLCALC 84 03/24/2021 0000   LDLCALC 88 03/12/2019 1113   LABVLDL 21 03/24/2021 0000    Latest Reference Range & Units 03/12/19 11:13 03/20/19 00:00 08/25/20 14:38 03/24/21 00:00 04/08/21 09:21  TSH 0.450 - 4.500 uIU/mL 0.34 (L) 0.57 0.704 0.384 (L) 0.425 (L)  Triiodothyronine,Free,Serum 2.0 - 4.4 pg/mL  3.3    2.8  T4,Free(Direct) 0.82 - 1.77 ng/dL  1.2  CANCELED 7.37  Thyroperoxidase Ab SerPl-aCnc 0 - 34 IU/mL     14  Thyroglobulin Antibody 0.0 - 0.9 IU/mL     <1.0  (L): Data is abnormally low      Assessment & Plan:   1. Abnormal TSH  she is being seen at a kind request of Paseda, Baird Kay, FNP.  Her repeat thyroid labs are WNL with the exception of TSH (TSH has improved- nearly in normal range).  Her antibody testing was negative, ruling out autoimmune thyroid dysfunction.  Given her mild symptoms and mostly normal labs, no intervention is necessary at this time.  It does NOT appear that her seizure-like activity is caused by her thyroid.  Will repeat thyroid labs in 3 months for surveillance.     -Patient is advised to maintain close follow up with Paseda, Baird Kay, FNP for primary care needs.   I spent 20 minutes in the care of the patient today including review of labs from Thyroid Function, CMP, and other relevant labs ; imaging/biopsy records (current and previous including abstractions from other facilities); face-to-face time discussing  her lab results and symptoms, medications doses, her options of short and long term treatment based on the latest standards of care / guidelines;   and documenting the encounter.  Doris Miller  participated in the discussions, expressed understanding, and voiced agreement with the above plans.  All questions were answered to her satisfaction. she is encouraged to contact clinic should she have any questions or concerns prior to her return visit.  Follow up plan: Return in about 3 months (around 07/24/2021) for Thyroid follow up, Previsit labs.   Thank you for involving me in the care of this pleasant patient, and I will continue to update you with her progress.  Ronny BaconWhitney Nataya Bastedo, Beckett SpringsFNP-BC [facility] [address] [place], [place] [address] Phone: [phone] Fax: [phone]  04/23/2021, 9:20 AM

## 2021-05-06 ENCOUNTER — Ambulatory Visit: Payer: Medicaid Other | Admitting: Nurse Practitioner

## 2021-05-08 IMAGING — DX DG HIP (WITH OR WITHOUT PELVIS) 2-3V*L*
3 series · 3 of 3 positions shown · non-contrast
Comparison: None.

CLINICAL DATA: Pain in left hip and back.

EXAM:
DG HIP (WITH OR WITHOUT PELVIS) 2-3V LEFT

[pelvis ap]
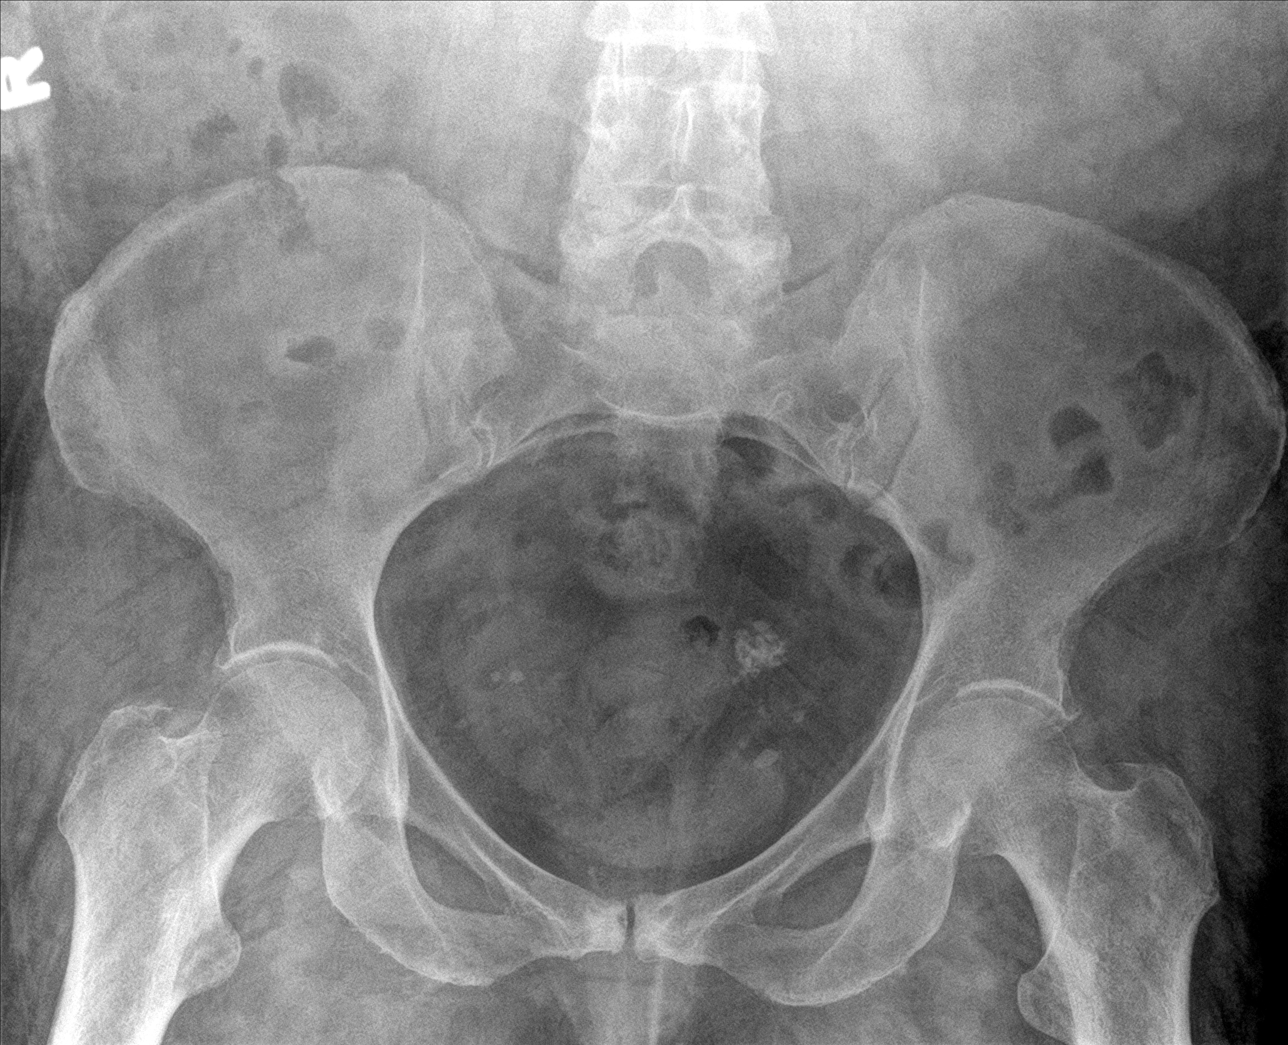

[hip ap]
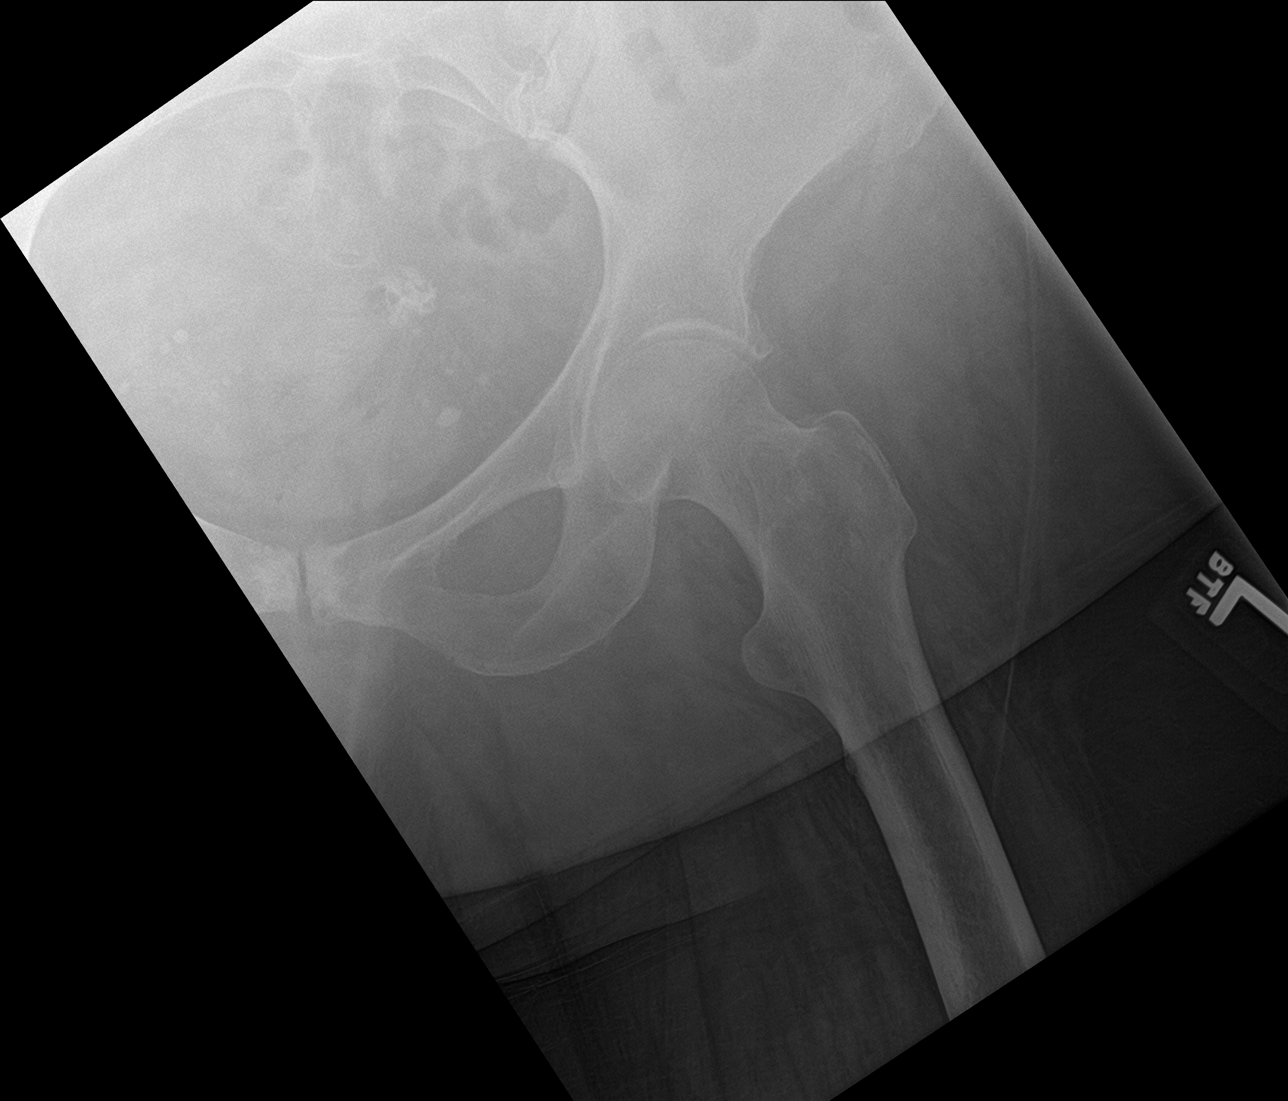

[hip lat]
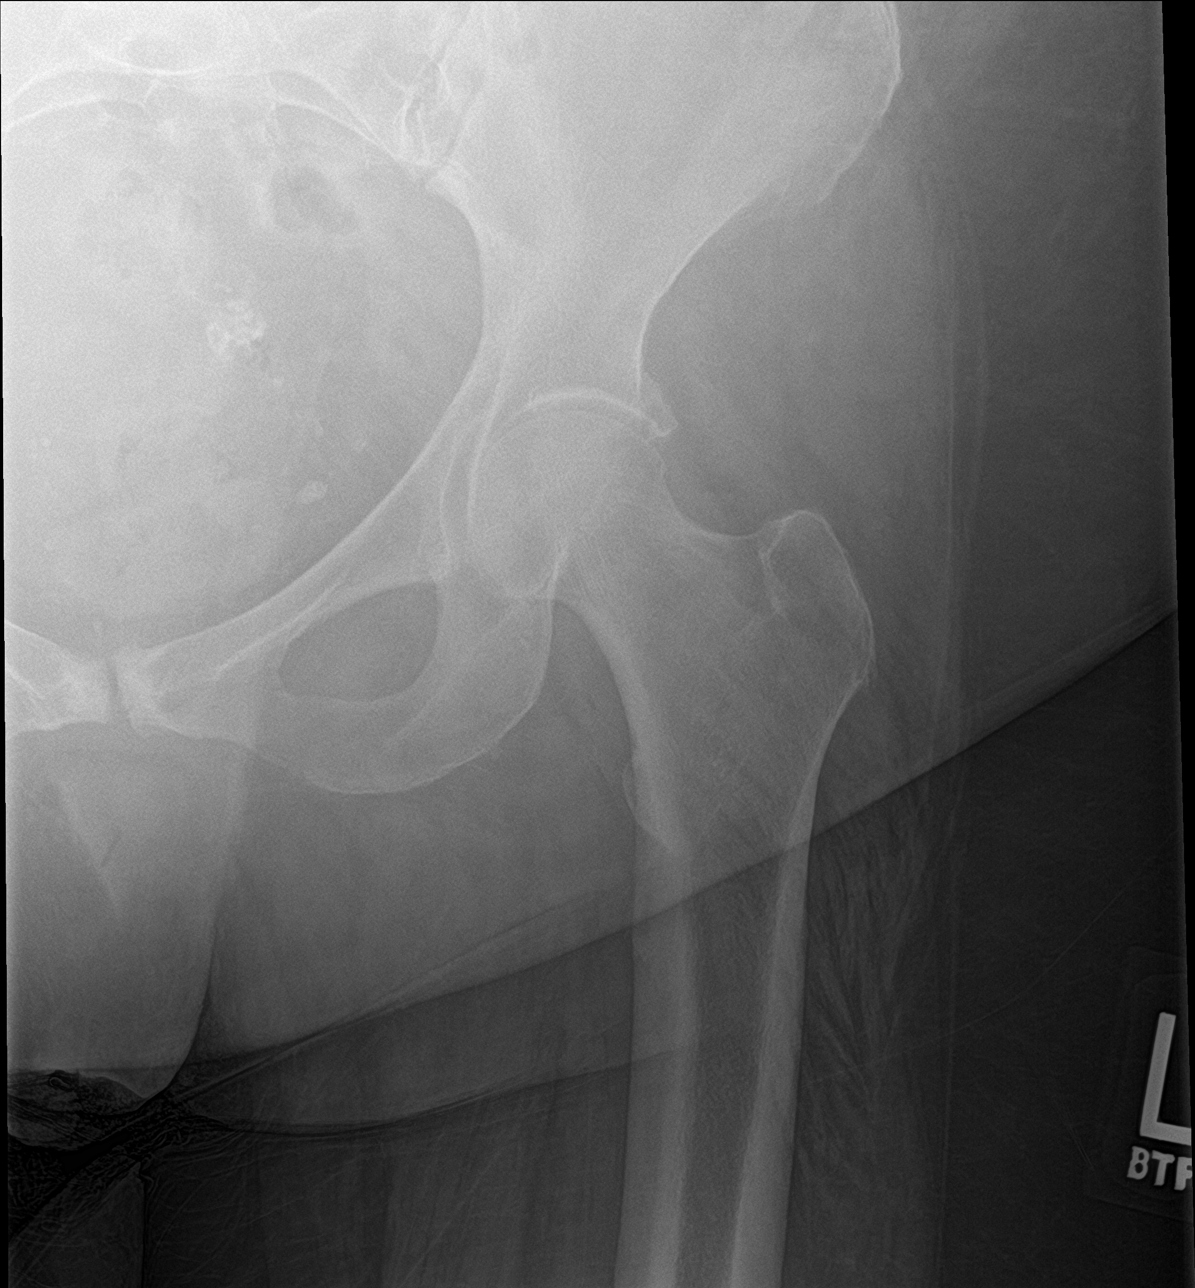

[3 of 3 positions shown; findings below may reference images not displayed]

FINDINGS: AP view of the pelvis and AP and lateral views of the left hip are
negative for fracture or dislocation. Soft tissues are unremarkable
aside from calcified air in the pelvis likely related to uterine
fibroid.
IMPRESSION: No signs of fracture or dislocation

## 2021-05-08 IMAGING — DX DG LUMBAR SPINE COMPLETE 4+V
5 series · 5 of 5 positions shown · non-contrast
Comparison: 08/24/2017

CLINICAL DATA: Worsening pain in lower back and left hip for
several months.

EXAM:
LUMBAR SPINE - COMPLETE 4+ VIEW

[l-spine ap]
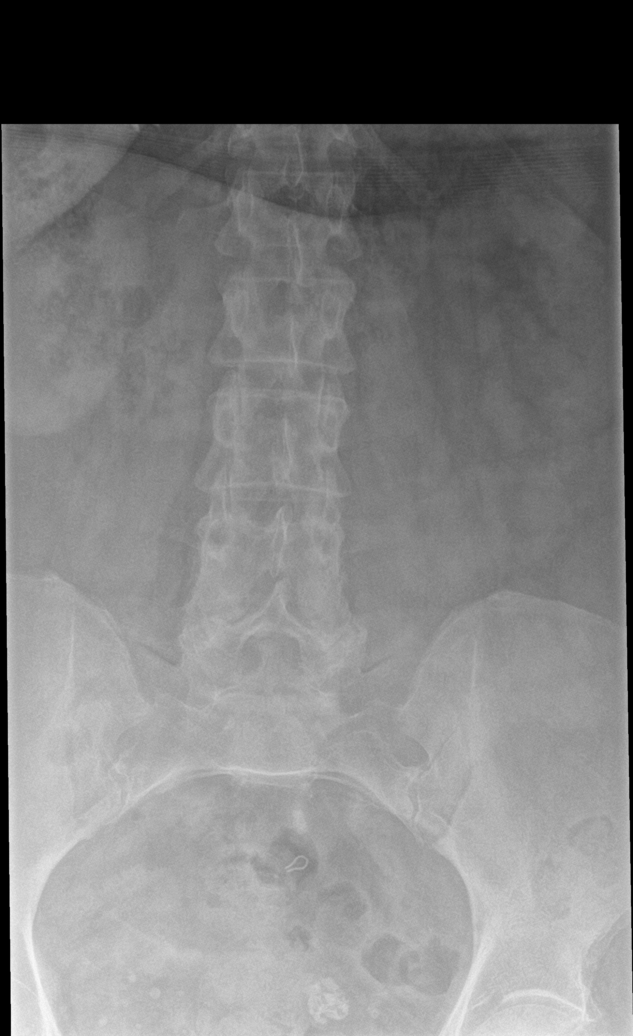

[l-spine obl (1 of 2)]
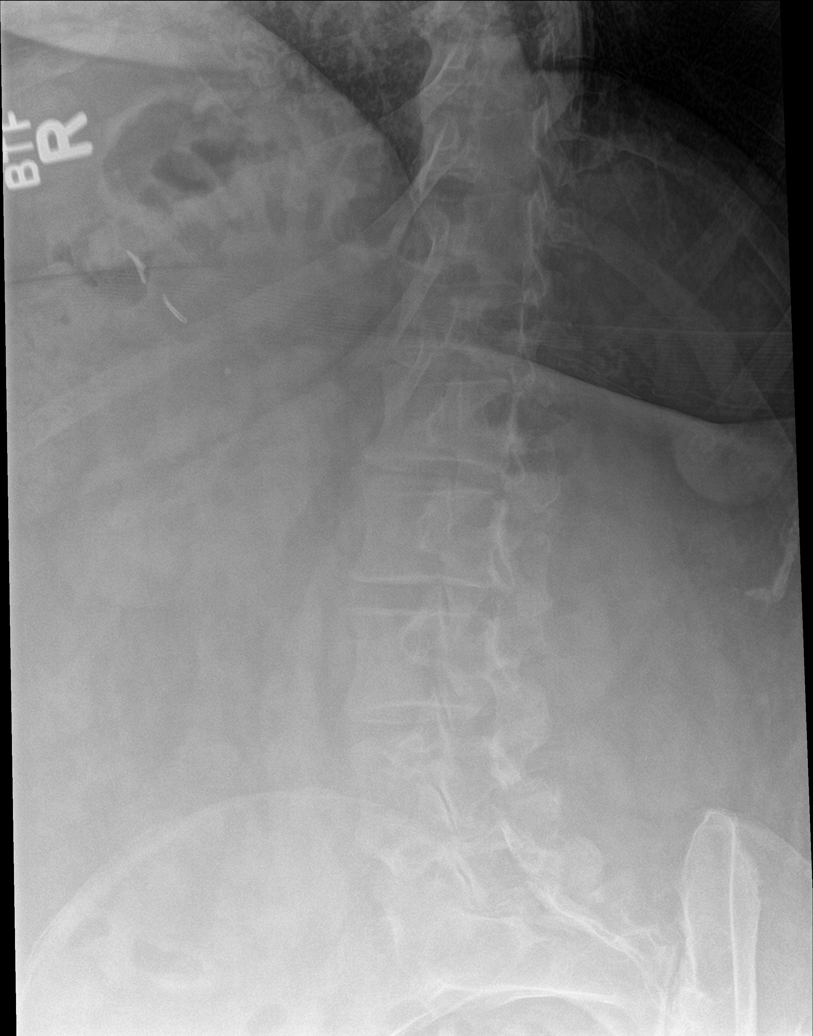

[l-spine obl (2 of 2)]
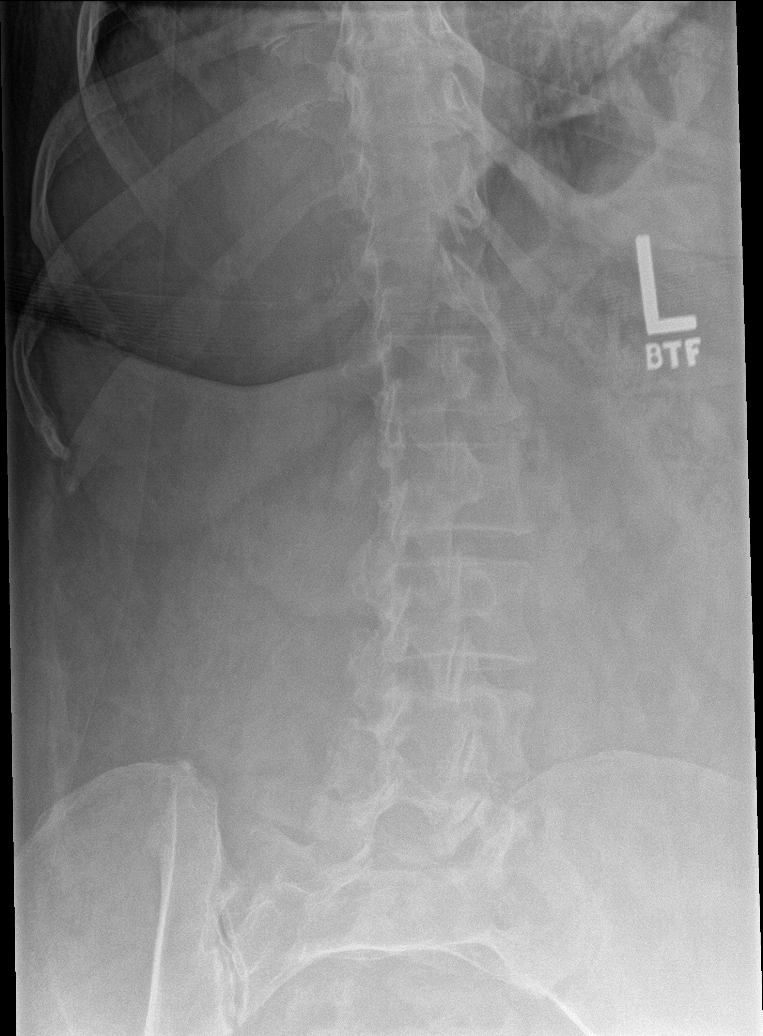

[l-spine lat]
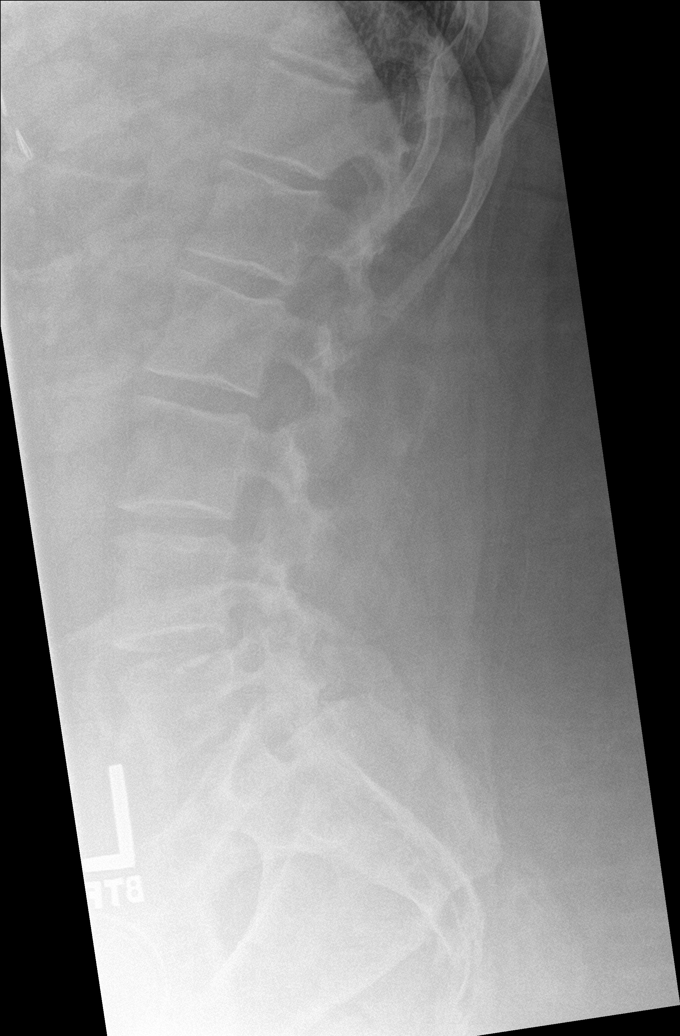

[l-spine spot]
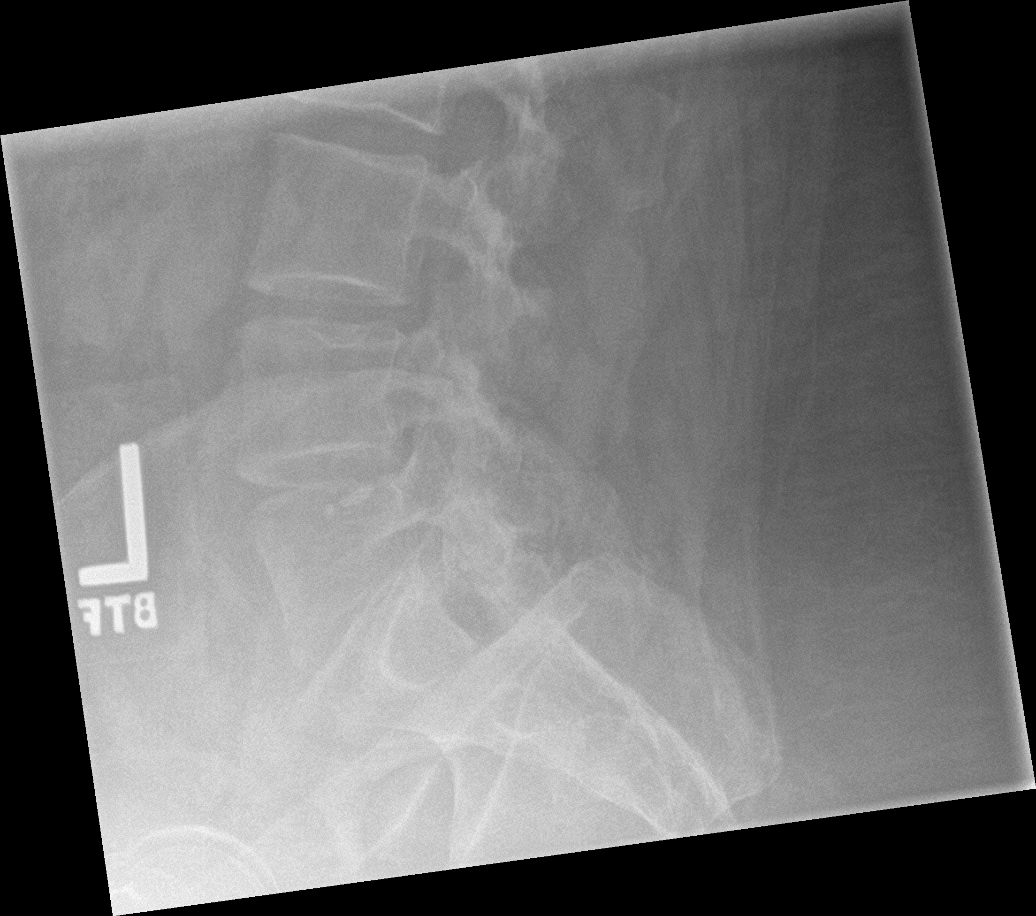

[5 of 5 positions shown; findings below may reference images not displayed]

FINDINGS: There is no evidence of lumbar spine fracture. Alignment is normal.
Intervertebral disc spaces are maintained.
IMPRESSION: Negative.

## 2021-06-15 ENCOUNTER — Other Ambulatory Visit: Payer: Self-pay | Admitting: Internal Medicine

## 2021-07-07 ENCOUNTER — Other Ambulatory Visit: Payer: Self-pay | Admitting: Nurse Practitioner

## 2021-07-07 DIAGNOSIS — K219 Gastro-esophageal reflux disease without esophagitis: Secondary | ICD-10-CM

## 2021-07-13 ENCOUNTER — Other Ambulatory Visit: Payer: Self-pay | Admitting: Internal Medicine

## 2021-07-13 DIAGNOSIS — F320 Major depressive disorder, single episode, mild: Secondary | ICD-10-CM

## 2021-07-21 ENCOUNTER — Ambulatory Visit: Payer: Medicaid Other | Admitting: Nurse Practitioner

## 2021-07-26 ENCOUNTER — Ambulatory Visit: Payer: Medicaid Other | Admitting: Nurse Practitioner

## 2021-09-07 ENCOUNTER — Other Ambulatory Visit: Payer: Self-pay | Admitting: Internal Medicine

## 2021-09-27 ENCOUNTER — Other Ambulatory Visit: Payer: Self-pay | Admitting: Nurse Practitioner

## 2021-09-27 DIAGNOSIS — K219 Gastro-esophageal reflux disease without esophagitis: Secondary | ICD-10-CM

## 2021-10-03 ENCOUNTER — Other Ambulatory Visit: Payer: Self-pay | Admitting: Internal Medicine

## 2021-10-03 DIAGNOSIS — F320 Major depressive disorder, single episode, mild: Secondary | ICD-10-CM

## 2021-10-20 ENCOUNTER — Other Ambulatory Visit: Payer: Self-pay

## 2021-10-20 DIAGNOSIS — Z1231 Encounter for screening mammogram for malignant neoplasm of breast: Secondary | ICD-10-CM

## 2021-10-22 ENCOUNTER — Encounter: Payer: Self-pay | Admitting: Internal Medicine

## 2021-10-22 ENCOUNTER — Ambulatory Visit: Payer: Medicaid Other | Admitting: Internal Medicine

## 2021-10-22 VITALS — BP 112/82 | HR 75 | Wt 342.2 lb

## 2021-10-22 DIAGNOSIS — F320 Major depressive disorder, single episode, mild: Secondary | ICD-10-CM | POA: Diagnosis not present

## 2021-10-22 DIAGNOSIS — R569 Unspecified convulsions: Secondary | ICD-10-CM

## 2021-10-22 DIAGNOSIS — M79604 Pain in right leg: Secondary | ICD-10-CM | POA: Diagnosis not present

## 2021-10-22 DIAGNOSIS — M545 Low back pain, unspecified: Secondary | ICD-10-CM

## 2021-10-22 MED ORDER — MELOXICAM 7.5 MG PO TABS: 7.5000 mg | ORAL_TABLET | Freq: Every day | ORAL | 0 refills | Status: AC

## 2021-10-22 NOTE — Progress Notes (Unsigned)
Acute Office Visit  Subjective:     Patient ID: Doris Miller, female    DOB: 08-Jul-1960, 61 y.o.   MRN: [id number]  Chief Complaint  Patient presents with   Leg Swelling   Ms. Navarrete presents today for an acute visit for evaluation of right leg pain.  She describes pain originating in the lateral portion of her right buttock and radiating down the back of her right leg.  There was no inciting event or trauma at the onset of pain.  Pain become more noticeable over the last several weeks.  She is able to perform ADLs independently, but is in significant pain.  She has tried Tylenol for pain relief.  Denies red flag symptoms of fever/chills, saddle anesthesia, bowel/bladder incontinence, unintentional weight loss, and night sweats.  Ms. Limb is accompanied by her granddaughter today, who presses concern over recent seizure-like episodes.  She states that her grandmother will regularly have episodes where she becomes unresponsive and has twitching of her eyes and contraction of her upper extremities.  These episodes last for 5-10 minutes.  She is unresponsive for close to 30 minutes after these episodes.  No one has ever noted any loss of bowel or bladder with these episodes.  Patient has never been her tongue during this episode.  She has never previously been evaluated for the symptoms.  On intake screening, is also noted that Ms. Snyders PHQ-9 score was significantly elevated (18).  She is taking Lexapro as prescribed.  She denies SI/HI currently.  She attributes her mood to her leg pain and is not interested in changing medications or a referral to counseling.  Review of Systems  Constitutional:  Negative for chills and fever.  HENT:  Negative for sore throat.   Respiratory:  Negative for cough and shortness of breath.   Cardiovascular:  Negative for chest pain, palpitations and leg swelling.  Gastrointestinal:  Negative for abdominal pain, blood in stool, constipation, diarrhea, nausea  and vomiting.  Genitourinary:  Negative for dysuria and hematuria.  Musculoskeletal:  Negative for myalgias.       Right leg pain  Skin:  Negative for itching and rash.  Neurological:  Negative for dizziness and headaches.       Reported seizure-like activity with eye twitching and upper extremity contractions  Psychiatric/Behavioral:  Negative for depression, substance abuse and suicidal ideas. The patient is not nervous/anxious.         Objective:    BP 112/82   Pulse 75   Wt (!) 342 lb 3.2 oz (155.2 kg)   LMP 06/13/2011   SpO2 98%   BMI 60.62 kg/m   Physical Exam Vitals reviewed.  Constitutional:      General: She is not in acute distress.    Appearance: She is obese. She is not toxic-appearing.  HENT:     Head: Normocephalic and atraumatic.     Right Ear: External ear normal.     Left Ear: External ear normal.     Nose: Nose normal. No congestion or rhinorrhea.     Mouth/Throat:     Mouth: Mucous membranes are moist.     Pharynx: Oropharynx is clear. No oropharyngeal exudate or posterior oropharyngeal erythema.  Eyes:     General: No scleral icterus.    Conjunctiva/sclera: Conjunctivae normal.     Pupils: Pupils are equal, round, and reactive to light.  Cardiovascular:     Rate and Rhythm: Normal rate and regular rhythm.     Pulses: Normal  pulses.     Heart sounds: Normal heart sounds. No murmur heard.    No friction rub. No gallop.  Pulmonary:     Effort: Pulmonary effort is normal.     Breath sounds: Normal breath sounds. No wheezing, rhonchi or rales.  Abdominal:     General: Abdomen is flat. Bowel sounds are normal. There is no distension.     Palpations: Abdomen is soft.     Tenderness: There is no abdominal tenderness.  Musculoskeletal:     Right lower leg: Edema present.     Left lower leg: Edema present.     Comments: Nonpitting edema of the lower extremities bilaterally.  There is tenderness palpation along the upper outer quadrant of the right  buttock.  ROM at the hip is limited secondary to pain.  ROM of the right knee is intact.  Positive SLR on the right leg.  FABER/FADIR testing difficult to perform secondary to body habitus.  Skin:    Capillary Refill: Capillary refill takes less than 2 seconds.  Neurological:     General: No focal deficit present.     Mental Status: She is alert and oriented to person, place, and time.  Psychiatric:        Behavior: Behavior normal.       Assessment & Plan:   Problem List Items Addressed This Visit       Other   Depression, major, single episode, mild (HCC)    Taking Lexapro as prescribed.  Her PHQ-9 score is elevated today, but she attributes this to her right leg pain and denies SI/HI.  She is not interested in medication adjustments or a referral to psychiatry.      Low back pain radiating to right leg    Description of pain and physical exam findings most consistent with sciatica. -I prescribed meloxicam 7.5 mg x 14 days for pain relief and provided home PT exercises. -Follow-up in 4 weeks for reassessment      Relevant Medications   meloxicam (MOBIC) 7.5 MG tablet   Witnessed seizure-like activity (HCC)    The patient's granddaughter reports regular episodes where the patient will become unresponsive with eye twitching and upper extremity contractions.  There is no incontinence or tongue biting previously noted.  She also describes a postictal state lasting close to 30 minutes. -Based on the description of his episodes, I am concerned for seizures.  Accordingly I refer the patient to neurology for further evaluation. -I have also instructed the patient that she should not drive due to possible undiagnosed seizures.  She expressed understanding.      Relevant Orders   Ambulatory referral to Neurology   Meds ordered this encounter  Medications   meloxicam (MOBIC) 7.5 MG tablet    Sig: Take 1 tablet (7.5 mg total) by mouth daily for 14 days.    Dispense:  14 tablet     Refill:  0    Return in about 4 weeks (around 11/19/2021).  Billie Lade, MD

## 2021-10-22 NOTE — Patient Instructions (Signed)
It was a pleasure to see you today.  Thank you for giving [place] the opportunity to be involved in your care.  Below is a brief recap of your visit and next steps.  We will plan to see you again in 1 month  Summary I have prescribed meloxicam x 2 week and provided you with home PT exercises for you back and leg pain I have referred you to neurology for possible seizure activity  Next steps Follow up in 1 month As we discussed, you should not drive due to concern for seizures.

## 2021-10-25 ENCOUNTER — Telehealth: Payer: Self-pay

## 2021-10-25 DIAGNOSIS — M79604 Pain in right leg: Secondary | ICD-10-CM

## 2021-10-25 DIAGNOSIS — M545 Low back pain, unspecified: Secondary | ICD-10-CM | POA: Insufficient documentation

## 2021-10-25 DIAGNOSIS — R569 Unspecified convulsions: Secondary | ICD-10-CM | POA: Insufficient documentation

## 2021-10-25 HISTORY — DX: Pain in right leg: M79.604

## 2021-10-25 NOTE — Assessment & Plan Note (Signed)
Taking Lexapro as prescribed.  Her PHQ-9 score is elevated today, but she attributes this to her right leg pain and denies SI/HI.  She is not interested in medication adjustments or a referral to psychiatry.

## 2021-10-25 NOTE — Telephone Encounter (Signed)
Patient called asked can some pain medication be sent into her pharmacy. Just had visit last Friday still leg pain. Pharmacy: [facility]

## 2021-10-25 NOTE — Assessment & Plan Note (Signed)
The patient's granddaughter reports regular episodes where the patient will become unresponsive with eye twitching and upper extremity contractions.  There is no incontinence or tongue biting previously noted.  She also describes a postictal state lasting close to 30 minutes. -Based on the description of his episodes, I am concerned for seizures.  Accordingly I refer the patient to neurology for further evaluation. -I have also instructed the patient that she should not drive due to possible undiagnosed seizures.  She expressed understanding.

## 2021-10-25 NOTE — Assessment & Plan Note (Addendum)
Description of pain and physical exam findings most consistent with sciatica. -I prescribed meloxicam 7.5 mg x 14 days for pain relief and provided home PT exercises. -Follow-up in 4 weeks for reassessment

## 2021-11-08 ENCOUNTER — Other Ambulatory Visit (HOSPITAL_COMMUNITY): Payer: Self-pay | Admitting: Internal Medicine

## 2021-11-08 DIAGNOSIS — Z1231 Encounter for screening mammogram for malignant neoplasm of breast: Secondary | ICD-10-CM

## 2021-11-09 ENCOUNTER — Other Ambulatory Visit: Payer: Self-pay

## 2021-11-09 DIAGNOSIS — Z1211 Encounter for screening for malignant neoplasm of colon: Secondary | ICD-10-CM

## 2021-11-15 ENCOUNTER — Ambulatory Visit (HOSPITAL_COMMUNITY): Payer: Medicaid Other

## 2021-11-17 ENCOUNTER — Encounter: Payer: Self-pay | Admitting: Neurology

## 2021-11-17 ENCOUNTER — Ambulatory Visit: Payer: Medicaid Other | Admitting: Neurology

## 2021-11-17 VITALS — BP 105/68 | HR 80 | Ht 63.0 in | Wt 340.6 lb

## 2021-11-17 DIAGNOSIS — R569 Unspecified convulsions: Secondary | ICD-10-CM

## 2021-11-17 NOTE — Patient Instructions (Signed)
Routine EEG, we will contact you to go over the result If EEG is normal and you continue to experience this episode, will consider ambulatory EEG Continue to follow with PCP Return sooner if worse.

## 2021-11-17 NOTE — Progress Notes (Signed)
[facility]  PATIENT: Doris Miller DOB: 13-May-1960  REQUESTING CLINICIAN: Billie Lade, MD HISTORY FROM: Patient and daughter in law  REASON FOR VISIT: Seizure like activity    HISTORICAL  CHIEF COMPLAINT:  Chief Complaint  Patient presents with   New Patient (Initial Visit)    RM 29 w/ daughter in law. Internal referral for seizure activity. First event year and a half ago. Last: 11/13/21. Gets a severe headache in right eye and radiates to the back of her head.Shaking/goes limp.  Losing consciousness w/ episodes. Episodes last about 3-4 min. Takes up to an hour to come back to baseline post episode.   Mother has seizures (started in her 44's), youngest brother has seizures (started at age 12), her middle son has seizures(started at age 12 but not on meds and has improved).     Other    Stress can bring on events. Has never had EEG. No MRI either.     HISTORY OF PRESENT ILLNESS:  This is a 61 year old woman past medical history of obesity, depression, who is presenting with seizure-like activity.  Patient is accompanied by her daughter-in-law.  They report these events started about 2 years ago, initially it was few but now they got worse since her sister's death in 02-25-2023.  Daughter-in-law described the episode as starting with left arm tensing up, sometimes shaking followed by eyes flickering then patient will lose consciousness and unresponsive.  Episodes can last up to 5-minutes. During these episodes, she denies any injury, no tongue biting or urinary incontinence.  Last episode was last week.  They report triggers are stress, when she is depressed or upset and sometimes when she is hot with the heat.  She has not never been worked up for these events in the past.  She does report a family history of seizures in her mother, her brother and her son.   Handedness: Right handed   Onset: 2 years ago   Seizure Type: Left arm contracted, eye flickering and  passing out, unresponsive   Current frequency: Weekly   Any injuries from seizures: None   Seizure risk factors: Family history: mother, brother and son   Previous ASMs: None   Currenty ASMs: None   ASMs side effects: NA   Brain Images: None   Previous EEGs: None    OTHER MEDICAL CONDITIONS: Depression, Obesity   REVIEW OF SYSTEMS: Full 14 system review of systems performed and negative with exception of: As noted in the HPI   ALLERGIES: Allergies  Allergen Reactions   Codeine Shortness Of Breath   Diclofenac Rash    HOME MEDICATIONS: Outpatient Medications Prior to Visit  Medication Sig Dispense Refill   albuterol (VENTOLIN HFA) 108 (90 Base) MCG/ACT inhaler Inhale 1-2 puffs into the lungs every 6 (six) hours as needed for wheezing or shortness of breath. 8 g 1   Cholecalciferol (VITAMIN D3) 25 MCG (1000 UT) CAPS Take 1 capsule (1,000 Units total) by mouth daily. 60 capsule 3   escitalopram (LEXAPRO) 20 MG tablet Take 1 tablet by mouth once daily 90 tablet 0   lisinopril-hydrochlorothiazide (ZESTORETIC) 10-12.5 MG tablet Take 1 tablet by mouth once daily 90 tablet 0   omeprazole (PRILOSEC) 20 MG capsule Take 1 capsule by mouth once daily 90 capsule 0   No facility-administered medications prior to visit.    PAST MEDICAL HISTORY: Past Medical History:  Diagnosis Date   Arthritis    Bronchospasm 03/12/2019   Depression  Encounter for hepatitis C screening test for low risk patient 03/12/2019   Encounter for screening for HIV 03/12/2019   Encounter for screening for malignant neoplasm of colon 03/12/2019   Encounter for screening mammogram for malignant neoplasm of breast 03/12/2019   GERD (gastroesophageal reflux disease)    Hypertension    Lymphedema    OA (osteoarthritis) of knee right     PAST SURGICAL HISTORY: Past Surgical History:  Procedure Laterality Date   CHOLECYSTECTOMY     cyst removal left wrist Left    TUBAL LIGATION      FAMILY  HISTORY: Family History  Problem Relation Age of Onset   CAD Mother    Breast cancer Mother    Endometrial cancer Mother    Seizures Mother    CAD Father    Emphysema Father    COPD Father    Seizures Brother    Epilepsy Son     SOCIAL HISTORY: Social History   Socioeconomic History   Marital status: Married    Spouse name: Dlmer   Number of children: 3   Years of education: 12   Highest education level: Not on file  Occupational History   Not on file  Tobacco Use   Smoking status: Never    Passive exposure: Past   Smokeless tobacco: Never  Vaping Use   Vaping Use: Never used  Substance and Sexual Activity   Alcohol use: No   Drug use: No   Sexual activity: Yes    Birth control/protection: Surgical  Other Topics Concern   Not on file  Social History Narrative   Lives with husband live together (3 children everyone lives close together)   Cat-Cricket       Enjoys crafting-with daughter in [employer], therapeutic, puzzles books       Diet: more snacking-not really eating meals    Caffeine: Dr Reino Kent- not sugar free (24 oz every 2 days)   Water: 1-3 cups      Does not wears seat belt -smootherly feeling    Smoke and carbon monoxide detectors    Does not use phone while driving          Right handed         Social Determinants of Health   Financial Resource Strain: Not on file  Food Insecurity: Not on file  Transportation Needs: Not on file  Physical Activity: Not on file  Stress: Not on file  Social Connections: Not on file  Intimate Partner Violence: Not on file     PHYSICAL EXAM  GENERAL EXAM/CONSTITUTIONAL: Vitals:  Vitals:   11/17/21 0918  BP: 105/68  Pulse: 80  Weight: (!) 340 lb 9.6 oz (154.5 kg)  Height: 5\' 3"  (1.6 m)   Body mass index is 60.33 kg/m. Wt Readings from Last 3 Encounters:  11/17/21 (!) 340 lb 9.6 oz (154.5 kg)  10/22/21 (!) 342 lb 3.2 oz (155.2 kg)  04/23/21 (!) 350 lb 6.4 oz (158.9 kg)   Patient is in no distress; well developed,  nourished and groomed; neck is supple  EYES: Pupils Miller and reactive to light, Visual fields full to confrontation, Extraocular movements intacts,  No results found.  MUSCULOSKELETAL: Gait, strength, tone, movements noted in Neurologic exam below  NEUROLOGIC: MENTAL STATUS:      No data to display         awake, alert, oriented to person, place and time recent and remote memory intact normal attention and concentration language fluent, comprehension intact, naming intact  fund of knowledge appropriate  CRANIAL NERVE:  2nd, 3rd, 4th, 6th - pupils equal and reactive to light, visual fields full to confrontation, extraocular muscles intact, no nystagmus 5th - facial sensation symmetric 7th - facial strength symmetric 8th - hearing intact 9th - palate elevates symmetrically, uvula midline 11th - shoulder shrug symmetric 12th - tongue protrusion midline  MOTOR:  normal bulk and tone, full strength in the BUE, BLE  SENSORY:  normal and symmetric to light touch  COORDINATION:  finger-nose-finger, fine finger movements normal  GAIT/STATION:  Antalgic gait     DIAGNOSTIC DATA (LABS, IMAGING, TESTING) - I reviewed patient records, labs, notes, testing and imaging myself where available.  Lab Results  Component Value Date   WBC 6.1 03/24/2021   HGB 13.2 03/24/2021   HCT 40.8 03/24/2021   MCV 83 03/24/2021   PLT 339 03/24/2021      Component Value Date/Time   NA 139 03/24/2021 0000   K 3.8 03/24/2021 0000   CL 101 03/24/2021 0000   CO2 20 03/24/2021 0000   GLUCOSE 93 03/24/2021 0000   GLUCOSE 99 03/12/2019 1113   BUN 14 03/24/2021 0000   CREATININE 0.91 03/24/2021 0000   CREATININE 0.95 03/12/2019 1113   CALCIUM 9.3 03/24/2021 0000   PROT 6.3 03/24/2021 0000   ALBUMIN 3.8 03/24/2021 0000   AST 12 03/24/2021 0000   ALT 11 03/24/2021 0000   ALKPHOS 82 03/24/2021 0000   BILITOT 0.3 03/24/2021 0000   GFRNONAA 67 03/12/2019 1113   GFRAA 78 03/12/2019 1113    Lab Results  Component Value Date   CHOL 163 03/24/2021   HDL 58 03/24/2021   LDLCALC 84 03/24/2021   TRIG 116 03/24/2021   Lab Results  Component Value Date   HGBA1C 5.6 08/25/2020   No results found for: "VITAMINB12" Lab Results  Component Value Date   TSH 0.425 (L) 04/08/2021      ASSESSMENT AND PLAN  61 y.o. year old female  with past medical history of obesity and depression who is presenting with seizure-like activity for the past 2 years getting worse lately.  They report having these episodes of unresponsiveness weekly.  I will start by getting a routine EEG and if negative and patient continues to experience these events I will obtain an ambulatory EEG.  We discussed possibility that these events are nonepileptic versus epileptic seizures.  Continue to follow-up with PCP and contact us if this event get worse.   1. Seizure-like activity ([place])     Patient Instructions  Routine EEG, we will contact you to go over the result If EEG is normal and you continue to experience this episode, will consider ambulatory EEG Continue to follow with PCP Return sooner if worse.   Per [facility] statutes, patients with seizures are not allowed to drive until they have been seizure-free for six months.  Other recommendations include using caution when using heavy equipment or power tools. Avoid working on ladders or at heights. Take showers instead of baths.  Do not swim alone.  Ensure the water temperature is not too high on the home water heater. Do not go swimming alone. Do not lock yourself in a room alone (i.e. bathroom). When caring for infants or small children, sit down when holding, feeding, or changing them to minimize risk of injury to the child in the event you have a seizure. Maintain good sleep hygiene. Avoid alcohol.  Also recommend adequate sleep, hydration, good diet and minimize stress.  During the Seizure  - First, ensure adequate ventilation and place  patients on the floor on their left side  Loosen clothing around the neck and ensure the airway is patent. If the patient is clenching the teeth, do not force the mouth open with any object as this can cause severe damage - Remove all items from the surrounding that can be hazardous. The patient may be oblivious to what's happening and may not even know what he or she is doing. If the patient is confused and wandering, either gently guide him/her away and block access to outside areas - Reassure the individual and be comforting - Call 911. In most cases, the seizure ends before EMS arrives. However, there are cases when seizures may last over 3 to 5 minutes. Or the individual may have developed breathing difficulties or severe injuries. If a pregnant patient or a person with diabetes develops a seizure, it is prudent to call an ambulance. - Finally, if the patient does not regain full consciousness, then call EMS. Most patients will remain confused for about 45 to 90 minutes after a seizure, so you must use judgment in calling for help. - Avoid restraints but make sure the patient is in a bed with padded side rails - Place the individual in a lateral position with the neck slightly flexed; this will help the saliva drain from the mouth and prevent the tongue from falling backward - Remove all nearby furniture and other hazards from the area - Provide verbal assurance as the individual is regaining consciousness - Provide the patient with privacy if possible - Call for help and start treatment as ordered by the caregiver   After the Seizure (Postictal Stage)  After a seizure, most patients experience confusion, fatigue, muscle pain and/or a headache. Thus, one should permit the individual to sleep. For the next few days, reassurance is essential. Being calm and helping reorient the person is also of importance.  Most seizures are painless and end spontaneously. Seizures are not harmful to others but  can lead to complications such as stress on the lungs, brain and the heart. Individuals with prior lung problems may develop labored breathing and respiratory distress.     Orders Placed This Encounter  Procedures   EEG adult    No orders of the defined types were placed in this encounter.   Return if symptoms worsen or fail to improve.  I have spent a total of 50 minutes dedicated to this patient today, preparing to see patient, performing a medically appropriate examination and evaluation, ordering tests and/or medications and procedures, and counseling and educating the patient/family/caregiver; independently interpreting result and communicating results to the family/patient/caregiver; and documenting clinical information in the electronic medical record.   Windell Norfolk, MD 11/17/2021, 1:06 PM  [facility] [address] [place], [place] [address] [phone]

## 2021-11-19 ENCOUNTER — Ambulatory Visit: Payer: Medicaid Other | Admitting: Internal Medicine

## 2021-11-19 ENCOUNTER — Encounter: Payer: Self-pay | Admitting: Internal Medicine

## 2021-11-19 VITALS — BP 101/58 | HR 99 | Ht 65.0 in | Wt 341.6 lb

## 2021-11-19 DIAGNOSIS — R569 Unspecified convulsions: Secondary | ICD-10-CM | POA: Diagnosis not present

## 2021-11-19 DIAGNOSIS — Z0000 Encounter for general adult medical examination without abnormal findings: Secondary | ICD-10-CM | POA: Diagnosis not present

## 2021-11-19 DIAGNOSIS — Z0001 Encounter for general adult medical examination with abnormal findings: Secondary | ICD-10-CM | POA: Diagnosis not present

## 2021-11-19 DIAGNOSIS — M170 Bilateral primary osteoarthritis of knee: Secondary | ICD-10-CM

## 2021-11-19 MED ORDER — MELOXICAM 7.5 MG PO TABS: 7.5000 mg | ORAL_TABLET | Freq: Every day | ORAL | 0 refills | Status: DC | PRN

## 2021-11-19 NOTE — Assessment & Plan Note (Addendum)
She has a previously documented history of osteoarthritis of the right knee.  Today she endorses pain in the right knee with ambulation. -Meloxicam prescribed for as needed use -We discussed that if her symptoms persist, we could refer her to sports medicine for injections

## 2021-11-19 NOTE — Assessment & Plan Note (Addendum)
She denies any seizure-like activity since her last appointment and is not driving currently.  She has establish care with neurology.  Seen earlier this week.  She will undergo EEG next week.

## 2021-11-19 NOTE — Patient Instructions (Signed)
It was a pleasure to see you today.  Thank you for giving [place] the opportunity to be involved in your care.  Below is a brief recap of your visit and next steps.  We will plan to see you again in 3 months.  Summary I have refilled meloxicam today for as needed use for pain. Continue to follow up with neurology to work up seizure activity We will plan for follow up in 3 months to for routine care

## 2021-11-19 NOTE — Assessment & Plan Note (Signed)
Ms. Trine has declined all outstanding vaccines and cancer screenings today.

## 2021-11-19 NOTE — Progress Notes (Signed)
Established Patient Office Visit  Subjective   Patient ID: Doris Miller, female    DOB: June 04, 1960  Age: 61 y.o. MRN: [id number]  Chief Complaint  Patient presents with   Follow-up   Doris Miller returns for follow-up today.  Doris Miller is a 61 year old woman with a past medical history significant for HTN, GERD, bilateral knee OA, depression, anxiety, and vitamin D deficiency.  Doris Miller was last seen by me on 10/22/2021 at which time Doris Miller presented for an acute visit for evaluation of right lower back pain radiating into her right leg.  Meloxicam 7.5 mg x 14 days was prescribed for pain relief.  Doris Miller also described seizure-like activity that was witnessed by her children.  Doris Miller was referred to neurology for evaluation and has since establish care.  Today Doris Miller states that Doris Miller feels well.  Doris Miller states that her leg pain has significantly improved.  Doris Miller still has pain in her right knee but this is in the setting of known osteoarthritis of the right knee.  Doris Miller otherwise feels well Doris Miller has had no recurrence of seizure-like activity.  Doris Miller was seen by neurology on 10/4.  Doris Miller will undergo EEG next week.  Acute concerns, chronic medical conditions, and outstanding preventative healthcare maintenance items discussed today are individually addressed in A/P below.  Past Medical History:  Diagnosis Date   Arthritis    Bronchospasm 03/12/2019   Depression    Encounter for hepatitis C screening test for low risk patient 03/12/2019   Encounter for screening for HIV 03/12/2019   Encounter for screening for malignant neoplasm of colon 03/12/2019   Encounter for screening mammogram for malignant neoplasm of breast 03/12/2019   GERD (gastroesophageal reflux disease)    Hypertension    Low back pain radiating to right leg 10/25/2021   Lymphedema    OA (osteoarthritis) of knee right    Past Surgical History:  Procedure Laterality Date   CHOLECYSTECTOMY     cyst removal left wrist Left    TUBAL LIGATION      Social History   Tobacco Use   Smoking status: Never    Passive exposure: Past   Smokeless tobacco: Never  Vaping Use   Vaping Use: Never used  Substance Use Topics   Alcohol use: No   Drug use: No   Family History  Problem Relation Age of Onset   CAD Mother    Breast cancer Mother    Endometrial cancer Mother    Seizures Mother    CAD Father    Emphysema Father    COPD Father    Seizures Brother    Epilepsy Son    Allergies  Allergen Reactions   Codeine Shortness Of Breath   Diclofenac Rash   Review of Systems  Musculoskeletal:  Positive for joint pain (Right knee pain).  All other systems reviewed and are negative.    Objective:     BP (!) 101/58   Pulse 99   Ht '5\' 5"'  (1.651 m)   Wt (!) 341 lb 9.6 oz (154.9 kg)   LMP 06/13/2011   SpO2 93%   BMI 56.85 kg/m    Physical Exam Vitals reviewed.  Constitutional:      General: Doris Miller is not in acute distress.    Appearance: Doris Miller is obese. Doris Miller is not toxic-appearing.  HENT:     Head: Normocephalic and atraumatic.     Right Ear: External ear normal.     Left Ear: External ear normal.  Nose: Nose normal. No congestion or rhinorrhea.     Mouth/Throat:     Mouth: Mucous membranes are moist.     Pharynx: Oropharynx is clear. No oropharyngeal exudate or posterior oropharyngeal erythema.  Eyes:     General: No scleral icterus.    Conjunctiva/sclera: Conjunctivae normal.     Pupils: Pupils are equal, round, and reactive to light.  Cardiovascular:     Rate and Rhythm: Normal rate and regular rhythm.     Pulses: Normal pulses.     Heart sounds: Normal heart sounds. No murmur heard.    No friction rub. No gallop.  Pulmonary:     Effort: Pulmonary effort is normal.     Breath sounds: Normal breath sounds. No wheezing, rhonchi or rales.  Abdominal:     General: Abdomen is flat. Bowel sounds are normal. There is no distension.     Palpations: Abdomen is soft.     Tenderness: There is no abdominal tenderness.   Musculoskeletal:     Right lower leg: Edema present.     Left lower leg: Edema present.     Comments: Nonpitting edema of the lower extremities bilaterally.   Skin:    Capillary Refill: Capillary refill takes less than 2 seconds.  Neurological:     General: No focal deficit present.     Mental Status: Doris Miller is alert and oriented to person, place, and time.  Psychiatric:        Behavior: Behavior normal.    Last CBC Lab Results  Component Value Date   WBC 6.1 03/24/2021   HGB 13.2 03/24/2021   HCT 40.8 03/24/2021   MCV 83 03/24/2021   MCH 26.8 03/24/2021   RDW 15.0 03/24/2021   PLT 339 40/98/1191   Last metabolic panel Lab Results  Component Value Date   GLUCOSE 93 03/24/2021   NA 139 03/24/2021   K 3.8 03/24/2021   CL 101 03/24/2021   CO2 20 03/24/2021   BUN 14 03/24/2021   CREATININE 0.91 03/24/2021   EGFR 73 03/24/2021   CALCIUM 9.3 03/24/2021   PROT 6.3 03/24/2021   ALBUMIN 3.8 03/24/2021   LABGLOB 2.5 03/24/2021   AGRATIO 1.5 03/24/2021   BILITOT 0.3 03/24/2021   ALKPHOS 82 03/24/2021   AST 12 03/24/2021   ALT 11 03/24/2021   ANIONGAP 10 10/25/2013   Last lipids Lab Results  Component Value Date   CHOL 163 03/24/2021   HDL 58 03/24/2021   LDLCALC 84 03/24/2021   TRIG 116 03/24/2021   CHOLHDL 2.8 03/24/2021   Last hemoglobin A1c Lab Results  Component Value Date   HGBA1C 5.6 08/25/2020   Last thyroid functions Lab Results  Component Value Date   TSH 0.425 (L) 04/08/2021   Last vitamin D Lab Results  Component Value Date   VD25OH 28.1 (L) 03/24/2021   The 10-year ASCVD risk score (Arnett DK, et al., 2019) is: 2%    Assessment & Plan:   Problem List Items Addressed This Visit     Osteoarthritis, knee - Primary    Doris Miller has a previously documented history of osteoarthritis of the right knee.  Today Doris Miller endorses pain in the right knee with ambulation. -Meloxicam prescribed for as needed use -We discussed that if her symptoms persist, we  could refer her to sports medicine for injections      Witnessed seizure-like activity ([place])    Doris Miller denies any seizure-like activity since her last appointment and is not driving currently.  Doris Miller has  establish care with neurology.  Seen earlier this week.  Doris Miller will undergo EEG next week.      Preventative health care    Doris Miller has declined all outstanding vaccines and cancer screenings today.      Return in about 3 months (around 02/19/2022).    Johnette Abraham, MD

## 2021-11-24 ENCOUNTER — Ambulatory Visit: Payer: Medicaid Other | Admitting: Neurology

## 2021-11-24 DIAGNOSIS — R569 Unspecified convulsions: Secondary | ICD-10-CM

## 2021-11-24 NOTE — Procedures (Signed)
    History:  61 year old woman with seizure like activity   EEG classification: Awake and drowsy  Description of the recording: The background rhythms of this recording consists of low voltage with no clear PDR. Present in the anterior head region is a 15-20 Hz beta activity. Photic stimulation was performed, did not show any abnormalities. Hyperventilation was also performed, did not show any abnormalities. Drowsiness was manifested by background fragmentation. No abnormal epileptiform discharges seen during this recording. There was no focal slowing. There were no electrographic seizure identified. EKG monitor shows no evidence of cardiac rhythm abnormalities with a heart rate of 72.  Abnormality: None   Impression: This is an essentially normal EEG recorded while drowsy and awake. No evidence of interictal epileptiform discharges. Normal EEGs, however, do not rule out epilepsy.    Alric Ran, MD [facility]

## 2021-12-14 ENCOUNTER — Other Ambulatory Visit: Payer: Self-pay | Admitting: Internal Medicine

## 2021-12-21 ENCOUNTER — Other Ambulatory Visit: Payer: Self-pay | Admitting: Internal Medicine

## 2021-12-21 DIAGNOSIS — M170 Bilateral primary osteoarthritis of knee: Secondary | ICD-10-CM

## 2022-01-10 ENCOUNTER — Other Ambulatory Visit: Payer: Self-pay | Admitting: Nurse Practitioner

## 2022-01-10 DIAGNOSIS — K219 Gastro-esophageal reflux disease without esophagitis: Secondary | ICD-10-CM

## 2022-01-11 ENCOUNTER — Other Ambulatory Visit: Payer: Self-pay | Admitting: Internal Medicine

## 2022-01-11 DIAGNOSIS — F320 Major depressive disorder, single episode, mild: Secondary | ICD-10-CM

## 2022-01-12 ENCOUNTER — Other Ambulatory Visit: Payer: Self-pay | Admitting: Internal Medicine

## 2022-01-12 DIAGNOSIS — M170 Bilateral primary osteoarthritis of knee: Secondary | ICD-10-CM

## 2022-02-18 ENCOUNTER — Other Ambulatory Visit: Payer: Self-pay | Admitting: Internal Medicine

## 2022-02-18 ENCOUNTER — Encounter: Payer: Self-pay | Admitting: Internal Medicine

## 2022-02-18 ENCOUNTER — Ambulatory Visit: Payer: Medicaid Other | Admitting: Internal Medicine

## 2022-02-18 VITALS — BP 123/82 | HR 117 | Ht 65.0 in | Wt 334.4 lb

## 2022-02-18 DIAGNOSIS — M170 Bilateral primary osteoarthritis of knee: Secondary | ICD-10-CM

## 2022-02-18 DIAGNOSIS — E559 Vitamin D deficiency, unspecified: Secondary | ICD-10-CM

## 2022-02-18 DIAGNOSIS — F411 Generalized anxiety disorder: Secondary | ICD-10-CM

## 2022-02-18 DIAGNOSIS — I10 Essential (primary) hypertension: Secondary | ICD-10-CM | POA: Diagnosis not present

## 2022-02-18 DIAGNOSIS — R569 Unspecified convulsions: Secondary | ICD-10-CM

## 2022-02-18 DIAGNOSIS — R053 Chronic cough: Secondary | ICD-10-CM

## 2022-02-18 MED ORDER — BENZONATATE 200 MG PO CAPS: 200.0000 mg | ORAL_CAPSULE | Freq: Three times a day (TID) | ORAL | 0 refills | Status: DC | PRN

## 2022-02-18 NOTE — Progress Notes (Unsigned)
Established Patient Office Visit  Subjective   Patient ID: Doris Miller, female    DOB: 1960/09/12  Age: 62 y.o. MRN: [id number]  Chief Complaint  Patient presents with   Osteoarthritis    Follow up   [name] returns to care today.  She was last seen by me on 10/6 at which time meloxicam was prescribed for as needed use osteoarthritis of the right knee.  In the interim she has been seen by neurology for follow-up.  She underwent EEG that was normal.  There have otherwise been no acute interval events.  Today [name] endorses a persistent dry cough that is not improving with over-the-counter cough medications.  She is otherwise asymptomatic and has no acute concerns to discuss.  Past Medical History:  Diagnosis Date   Arthritis    Bronchospasm 03/12/2019   Depression    Encounter for hepatitis C screening test for low risk patient 03/12/2019   Encounter for screening for HIV 03/12/2019   Encounter for screening for malignant neoplasm of colon 03/12/2019   Encounter for screening mammogram for malignant neoplasm of breast 03/12/2019   GERD (gastroesophageal reflux disease)    Hypertension    Low back pain radiating to right leg 10/25/2021   Lymphedema    OA (osteoarthritis) of knee right    Past Surgical History:  Procedure Laterality Date   CHOLECYSTECTOMY     cyst removal left wrist Left    TUBAL LIGATION     Social History   Tobacco Use   Smoking status: Never    Passive exposure: Past   Smokeless tobacco: Never  Vaping Use   Vaping Use: Never used  Substance Use Topics   Alcohol use: No   Drug use: No   Family History  Problem Relation Age of Onset   CAD Mother    Breast cancer Mother    Endometrial cancer Mother    Seizures Mother    CAD Father    Emphysema Father    COPD Father    Seizures Brother    Epilepsy Son    Allergies  Allergen Reactions   Codeine Shortness Of Breath   Diclofenac Rash   Review of Systems  Respiratory:  Positive for cough  (persistent, dry). Negative for sputum production.   All other systems reviewed and are negative.    Objective:     BP 123/82   Pulse (!) 117   Ht 5\' 5"  (1.651 m)   Wt (!) 334 lb 6.4 oz (151.7 kg)   LMP 06/13/2011   SpO2 96%   BMI 55.65 kg/m  BP Readings from Last 3 Encounters:  02/18/22 123/82  11/19/21 (!) 101/58  11/17/21 105/68   Physical Exam Vitals reviewed.  Constitutional:      General: She is not in acute distress.    Appearance: She is obese. She is not toxic-appearing.  HENT:     Head: Normocephalic and atraumatic.     Right Ear: External ear normal.     Left Ear: External ear normal.     Nose: Nose normal. No congestion or rhinorrhea.     Mouth/Throat:     Mouth: Mucous membranes are moist.     Pharynx: Oropharynx is clear. No oropharyngeal exudate or posterior oropharyngeal erythema.  Eyes:     General: No scleral icterus.    Conjunctiva/sclera: Conjunctivae normal.     Pupils: Pupils are equal, round, and reactive to light.  Cardiovascular:     Rate and Rhythm:  Normal rate and regular rhythm.     Pulses: Normal pulses.     Heart sounds: Normal heart sounds. No murmur heard.    No friction rub. No gallop.  Pulmonary:     Effort: Pulmonary effort is normal.     Breath sounds: Normal breath sounds. No wheezing, rhonchi or rales.  Abdominal:     General: Abdomen is flat. Bowel sounds are normal. There is no distension.     Palpations: Abdomen is soft.     Tenderness: There is no abdominal tenderness.  Musculoskeletal:     Right lower leg: Edema present.     Left lower leg: Edema present.     Comments: Nonpitting edema of the lower extremities bilaterally.   Skin:    Capillary Refill: Capillary refill takes less than 2 seconds.  Neurological:     General: No focal deficit present.     Mental Status: She is alert and oriented to person, place, and time.  Psychiatric:        Behavior: Behavior normal.    Last CBC Lab Results  Component Value Date    WBC 6.1 03/24/2021   HGB 13.2 03/24/2021   HCT 40.8 03/24/2021   MCV 83 03/24/2021   MCH 26.8 03/24/2021   RDW 15.0 03/24/2021   PLT 339 03/24/2021   Last metabolic panel Lab Results  Component Value Date   GLUCOSE 93 03/24/2021   NA 139 03/24/2021   K 3.8 03/24/2021   CL 101 03/24/2021   CO2 20 03/24/2021   BUN 14 03/24/2021   CREATININE 0.91 03/24/2021   EGFR 73 03/24/2021   CALCIUM 9.3 03/24/2021   PROT 6.3 03/24/2021   ALBUMIN 3.8 03/24/2021   LABGLOB 2.5 03/24/2021   AGRATIO 1.5 03/24/2021   BILITOT 0.3 03/24/2021   ALKPHOS 82 03/24/2021   AST 12 03/24/2021   ALT 11 03/24/2021   ANIONGAP 10 10/25/2013   Last lipids Lab Results  Component Value Date   CHOL 163 03/24/2021   HDL 58 03/24/2021   LDLCALC 84 03/24/2021   TRIG 116 03/24/2021   CHOLHDL 2.8 03/24/2021   Last hemoglobin A1c Lab Results  Component Value Date   HGBA1C 5.6 08/25/2020   Last thyroid functions Lab Results  Component Value Date   TSH 0.425 (L) 04/08/2021   Last vitamin D Lab Results  Component Value Date   VD25OH 28.1 (L) 03/24/2021   The 10-year ASCVD risk score (Arnett DK, et al., 2019) is: 3%    Assessment & Plan:   Problem List Items Addressed This Visit       Hypertension    She is currently prescribed lisinopril-HCTZ 10-12.5 mg daily for treatment of hypertension.  Her blood pressure today is 123/82. -No medication changes today.  Continue lisinopril-HCTZ as prescribed.      Osteoarthritis, knee    Previously documented history of right knee OA.  Meloxicam was prescribed for as needed use at her last appointment.  She reports that her pain is well-controlled with use of meloxicam, however she is having to take medication on a daily basis. -I expressed concern to Ms. Commons over NSAID use for treatment of OA.  She has been referred to orthopedic surgery to discuss additional treatment options.      Vitamin D deficiency    Previous history of vitamin D deficiency.   Her last vitamin D level was 28 in February 2023.  She is currently on daily vitamin D supplementation.  No changes today.  GAD (generalized anxiety disorder)    Symptoms are well-controlled with Lexapro 20 mg daily.  Denies SI/HI.  No changes today.      Witnessed seizure-like activity [facility])    She has established care with neurology.  EEG was negative.  She has not had any seizure-like events in 2 months. -She was again counseled on driving restrictions for at least 6 months from the date of her last seizure-like event.      Persistent dry cough - Primary    She endorses a persistent dry cough today that has not improved with over-the-counter cough medications.  Her cough is nonproductive.  She denies fever/chills as well as shortness of breath.  Her pulmonary exam is unremarkable today. -I have prescribed Tessalon Perles for as needed use for cough relief       Return in about 3 months (around 05/20/2022).    Johnette Abraham, MD

## 2022-02-18 NOTE — Patient Instructions (Signed)
It was a pleasure to see you today.  Thank you for giving [place] the opportunity to be involved in your care.  Below is a brief recap of your visit and next steps.  We will plan to see you again in 3 months.  Summary I have prescribed Tessalon perles for cough relief Orthopedic surgery referral placed for knee pain I recommend not driving for at least 6 months since your last seizure-like episode We will follow up in 3 months.

## 2022-02-23 DIAGNOSIS — R053 Chronic cough: Secondary | ICD-10-CM | POA: Insufficient documentation

## 2022-02-23 NOTE — Assessment & Plan Note (Signed)
She has established care with neurology.  EEG was negative.  She has not had any seizure-like events in 2 months. -She was again counseled on driving restrictions for at least 6 months from the date of her last seizure-like event.

## 2022-02-23 NOTE — Assessment & Plan Note (Signed)
She endorses a persistent dry cough today that has not improved with over-the-counter cough medications.  Her cough is nonproductive.  She denies fever/chills as well as shortness of breath.  Her pulmonary exam is unremarkable today. -I have prescribed Tessalon Perles for as needed use for cough relief

## 2022-02-23 NOTE — Assessment & Plan Note (Signed)
Previous history of vitamin D deficiency.  Her last vitamin D level was 28 in February 2023.  She is currently on daily vitamin D supplementation.  No changes today.

## 2022-02-23 NOTE — Assessment & Plan Note (Signed)
She is currently prescribed lisinopril-HCTZ 10-12.5 mg daily for treatment of hypertension.  Her blood pressure today is 123/82. -No medication changes today.  Continue lisinopril-HCTZ as prescribed.

## 2022-02-23 NOTE — Assessment & Plan Note (Addendum)
Symptoms are well-controlled with Lexapro 20 mg daily.  Denies SI/HI.  No changes today.

## 2022-02-23 NOTE — Assessment & Plan Note (Signed)
Previously documented history of right knee OA.  Meloxicam was prescribed for as needed use at her last appointment.  She reports that her pain is well-controlled with use of meloxicam, however she is having to take medication on a daily basis. -I expressed concern to Doris Miller over NSAID use for treatment of OA.  She has been referred to orthopedic surgery to discuss additional treatment options.

## 2022-03-07 ENCOUNTER — Ambulatory Visit (INDEPENDENT_AMBULATORY_CARE_PROVIDER_SITE_OTHER): Payer: Medicaid Other

## 2022-03-07 ENCOUNTER — Ambulatory Visit (INDEPENDENT_AMBULATORY_CARE_PROVIDER_SITE_OTHER): Payer: Medicaid Other | Admitting: Orthopedic Surgery

## 2022-03-07 ENCOUNTER — Encounter: Payer: Self-pay | Admitting: Orthopedic Surgery

## 2022-03-07 VITALS — BP 123/71 | HR 82 | Ht 65.0 in | Wt 342.4 lb

## 2022-03-07 DIAGNOSIS — M1712 Unilateral primary osteoarthritis, left knee: Secondary | ICD-10-CM | POA: Diagnosis not present

## 2022-03-07 DIAGNOSIS — M1711 Unilateral primary osteoarthritis, right knee: Secondary | ICD-10-CM | POA: Diagnosis not present

## 2022-03-07 DIAGNOSIS — G8929 Other chronic pain: Secondary | ICD-10-CM | POA: Diagnosis not present

## 2022-03-07 DIAGNOSIS — M25561 Pain in right knee: Secondary | ICD-10-CM | POA: Diagnosis not present

## 2022-03-07 DIAGNOSIS — M170 Bilateral primary osteoarthritis of knee: Secondary | ICD-10-CM

## 2022-03-07 NOTE — Patient Instructions (Addendum)
Instructions Following Joint Injections  In clinic today, you received an injection in one of your joints (sometimes more than one).  Occasionally, you can have some pain at the injection site, this is normal.  You can place ice at the injection site, or take over-the-counter medications such as Tylenol (acetaminophen) or Advil (ibuprofen).  Please follow all directions listed on the bottle.  If your joint (knee or shoulder) becomes swollen, red or very painful, please contact the clinic for additional assistance.   Two medications were injected, including lidocaine and a steroid (often referred to as cortisone).  Lidocaine is effective almost immediately but wears off quickly.  However, the steroid can take a few days to improve your symptoms.  In some cases, it can make your pain worse for a couple of days.  Do not be concerned if this happens as it is common.  You can apply ice or take some over-the-counter medications as needed.     Call to schedule another appointment for your left knee if you are interested in another injection

## 2022-03-07 NOTE — Progress Notes (Signed)
New Patient Visit  Assessment: Doris Miller is a 62 y.o. female with the following: 1. Arthritis of right knee 2. Arthritis of left knee  Plan: Doris Miller has advanced degenerative changes in bilateral knees.  She is currently taking meloxicam.  She has not had injections in the past.  She notes progressively worsening of her pain.  Radiographs demonstrate severe loss of joint space, with associated osteophytes.  Offered her steroid injections today, and she would like to try an injection of the right knee, as this is more painful.  This was completed today without issues.  She will contact the clinic if she has further issues, or is not interested in an injection in the left knee.  We briefly discussed surgery, but because her BMI is 56, she would not qualify for total knee arthroplasty.  She states her understanding.  She will follow-up as needed.  Procedure note injection Right knee joint   Verbal consent was obtained to inject the right knee joint  Timeout was completed to confirm the site of injection.  The skin was prepped with alcohol and ethyl chloride was sprayed at the injection site.  A 21-gauge needle was used to inject 40 mg of Depo-Medrol and 1% lidocaine (3 cc) into the right knee using an anterolateral approach.  There were no complications. A sterile bandage was applied.   The patient meets the AMA guidelines for Morbid obesity with BMI > 40.  The patient has been counseled on weight loss.    Follow-up: Return if symptoms worsen or fail to improve.  Subjective:  Chief Complaint  Patient presents with   New Patient (Initial Visit)   Knee Pain    Bilateral knee pain//R>L Painful ~3-4 years HiX of fall on RT knee around 2000    History of Present Illness: Doris Miller is a 62 y.o. female who has been referred by Doris Hauser, MD for evaluation of bilateral knee pain.  She states she has had pain in both knees for several years.  It is progressively  worsening.  No specific injury, but she does report falling onto her knees a couple of years ago.  She was seen in [facility], at the emergency department for this.  No specific treatment at that time.  She is currently taking meloxicam, provided by Dr. Doren Miller.  She has not had an injection.  She has not worked with physical therapy.  Pain is worse in the right knee, compared to the left.  Pain is primarily within the medial aspect of both knees.   Review of Systems: No fevers or chills No numbness or tingling No chest pain No shortness of breath No bowel or bladder dysfunction No GI distress No headaches   Medical History:  Past Medical History:  Diagnosis Date   Arthritis    Bronchospasm 03/12/2019   Depression    Encounter for hepatitis C screening test for low risk patient 03/12/2019   Encounter for screening for HIV 03/12/2019   Encounter for screening for malignant neoplasm of colon 03/12/2019   Encounter for screening mammogram for malignant neoplasm of breast 03/12/2019   GERD (gastroesophageal reflux disease)    Hypertension    Low back pain radiating to right leg 10/25/2021   Lymphedema    OA (osteoarthritis) of knee right     Past Surgical History:  Procedure Laterality Date   CHOLECYSTECTOMY     cyst removal left wrist Left    TUBAL LIGATION  Family History  Problem Relation Age of Onset   CAD Mother    Breast cancer Mother    Endometrial cancer Mother    Seizures Mother    CAD Father    Emphysema Father    COPD Father    Seizures Brother    Epilepsy Son    Social History   Tobacco Use   Smoking status: Never    Passive exposure: Past   Smokeless tobacco: Never  Vaping Use   Vaping Use: Never used  Substance Use Topics   Alcohol use: No   Drug use: No    Allergies  Allergen Reactions   Codeine Shortness Of Breath   Diclofenac Rash    Current Meds  Medication Sig   albuterol (VENTOLIN HFA) 108 (90 Base) MCG/ACT inhaler Inhale 1-2 puffs  into the lungs every 6 (six) hours as needed for wheezing or shortness of breath.   benzonatate (TESSALON) 200 MG capsule Take 1 capsule (200 mg total) by mouth 3 (three) times daily as needed for up to 30 doses for cough.   Cholecalciferol (VITAMIN D3) 25 MCG (1000 UT) CAPS Take 1 capsule (1,000 Units total) by mouth daily.   escitalopram (LEXAPRO) 20 MG tablet Take 1 tablet by mouth once daily   lisinopril-hydrochlorothiazide (ZESTORETIC) 10-12.5 MG tablet Take 1 tablet by mouth once daily   meloxicam (MOBIC) 7.5 MG tablet TAKE 1 TABLET BY MOUTH ONCE DAILY AS NEEDED FOR PAIN   omeprazole (PRILOSEC) 20 MG capsule Take 1 capsule by mouth once daily    Objective: BP 123/71   Pulse 82   Ht 5\' 5"  (1.651 m)   Wt (!) 342 lb 6.4 oz (155.3 kg)   LMP 06/13/2011   BMI 56.98 kg/m   Physical Exam:  General: Alert and oriented., No acute distress., and Obese female. Gait: Slow, steady gait.  Lateral knees without deformity.  Bilateral lower extremities with evidence of vascular congestion.  No open sores.  Range of motion from 10-100 degrees with obvious discomfort.  Tenderness to palpation medially.  No obvious laxity to varus or valgus stress.  Negative Lachman.  IMAGING: I personally ordered and reviewed the following images  X-rays of both knees were obtained in clinic today.  No acute injuries are noted.  Complete loss of joint space within the medial compartment of both knees.  Associated osteophytes.  Loss of joint space within the patellofemoral compartment, with associated osteophytes.  There is some subchondral sclerosis bilaterally.  Impression: Severe bilateral knee arthritis, with complete loss of joint space within the medial compartment   New Medications:  No orders of the defined types were placed in this encounter.     Doris Rasmussen, MD  03/07/2022 10:09 AM

## 2022-03-09 ENCOUNTER — Other Ambulatory Visit: Payer: Self-pay | Admitting: Internal Medicine

## 2022-03-21 ENCOUNTER — Other Ambulatory Visit: Payer: Self-pay | Admitting: Internal Medicine

## 2022-03-21 DIAGNOSIS — M170 Bilateral primary osteoarthritis of knee: Secondary | ICD-10-CM

## 2022-04-08 ENCOUNTER — Other Ambulatory Visit: Payer: Self-pay | Admitting: Internal Medicine

## 2022-04-08 DIAGNOSIS — K219 Gastro-esophageal reflux disease without esophagitis: Secondary | ICD-10-CM

## 2022-04-14 ENCOUNTER — Other Ambulatory Visit: Payer: Self-pay | Admitting: Internal Medicine

## 2022-04-14 ENCOUNTER — Encounter: Payer: Self-pay | Admitting: Radiology

## 2022-04-14 DIAGNOSIS — F320 Major depressive disorder, single episode, mild: Secondary | ICD-10-CM

## 2022-04-20 ENCOUNTER — Other Ambulatory Visit: Payer: Self-pay | Admitting: Internal Medicine

## 2022-04-20 DIAGNOSIS — F320 Major depressive disorder, single episode, mild: Secondary | ICD-10-CM

## 2022-04-23 ENCOUNTER — Other Ambulatory Visit: Payer: Self-pay | Admitting: Internal Medicine

## 2022-04-23 DIAGNOSIS — M170 Bilateral primary osteoarthritis of knee: Secondary | ICD-10-CM

## 2022-05-20 ENCOUNTER — Ambulatory Visit: Payer: Medicaid Other | Admitting: Internal Medicine

## 2022-05-23 ENCOUNTER — Other Ambulatory Visit: Payer: Self-pay | Admitting: Internal Medicine

## 2022-05-23 DIAGNOSIS — M170 Bilateral primary osteoarthritis of knee: Secondary | ICD-10-CM

## 2022-06-07 ENCOUNTER — Other Ambulatory Visit: Payer: Self-pay | Admitting: Internal Medicine

## 2022-06-26 ENCOUNTER — Other Ambulatory Visit: Payer: Self-pay | Admitting: Internal Medicine

## 2022-06-26 DIAGNOSIS — M170 Bilateral primary osteoarthritis of knee: Secondary | ICD-10-CM

## 2022-07-05 ENCOUNTER — Encounter: Payer: Self-pay | Admitting: Internal Medicine

## 2022-07-05 ENCOUNTER — Telehealth: Payer: Self-pay | Admitting: Internal Medicine

## 2022-07-05 ENCOUNTER — Other Ambulatory Visit: Payer: Self-pay

## 2022-07-05 DIAGNOSIS — R053 Chronic cough: Secondary | ICD-10-CM

## 2022-07-05 MED ORDER — BENZONATATE 200 MG PO CAPS: 200.0000 mg | ORAL_CAPSULE | Freq: Three times a day (TID) | ORAL | 0 refills | Status: DC | PRN

## 2022-07-05 NOTE — Telephone Encounter (Signed)
Pt called and requested something for her cough. Will schedule appt if needed.   benzonatate (TESSALON) 200 MG capsule [[id number]]

## 2022-07-05 NOTE — Telephone Encounter (Signed)
Refill sent.

## 2022-07-07 ENCOUNTER — Other Ambulatory Visit: Payer: Self-pay | Admitting: Internal Medicine

## 2022-07-07 DIAGNOSIS — K219 Gastro-esophageal reflux disease without esophagitis: Secondary | ICD-10-CM

## 2022-08-18 ENCOUNTER — Other Ambulatory Visit: Payer: Self-pay | Admitting: Internal Medicine

## 2022-08-18 DIAGNOSIS — M170 Bilateral primary osteoarthritis of knee: Secondary | ICD-10-CM

## 2022-08-22 ENCOUNTER — Telehealth: Payer: Self-pay

## 2022-08-22 NOTE — Telephone Encounter (Signed)
Chart review completed for patient. Patient is due for screening mammogram. Mychart message sent to patient to inquire about scheduling mammogram.  Osaze Hubbert, Population Health Specialist.  

## 2022-09-03 ENCOUNTER — Emergency Department (HOSPITAL_COMMUNITY): Payer: Medicaid Other

## 2022-09-03 ENCOUNTER — Emergency Department (HOSPITAL_COMMUNITY)
Admission: EM | Admit: 2022-09-03 | Discharge: 2022-09-03 | Disposition: A | Discharge status: Home / Self Care | Payer: Medicaid Other | Attending: Emergency Medicine | Admitting: Emergency Medicine

## 2022-09-03 ENCOUNTER — Other Ambulatory Visit: Payer: Self-pay

## 2022-09-03 ENCOUNTER — Encounter (HOSPITAL_COMMUNITY): Payer: Self-pay | Admitting: Emergency Medicine

## 2022-09-03 DIAGNOSIS — M549 Dorsalgia, unspecified: Secondary | ICD-10-CM | POA: Diagnosis not present

## 2022-09-03 DIAGNOSIS — Y92410 Unspecified street and highway as the place of occurrence of the external cause: Secondary | ICD-10-CM | POA: Diagnosis not present

## 2022-09-03 DIAGNOSIS — M79631 Pain in right forearm: Secondary | ICD-10-CM | POA: Diagnosis not present

## 2022-09-03 DIAGNOSIS — S0990XA Unspecified injury of head, initial encounter: Secondary | ICD-10-CM | POA: Insufficient documentation

## 2022-09-03 DIAGNOSIS — Z79899 Other long term (current) drug therapy: Secondary | ICD-10-CM | POA: Insufficient documentation

## 2022-09-03 DIAGNOSIS — M25519 Pain in unspecified shoulder: Secondary | ICD-10-CM | POA: Diagnosis not present

## 2022-09-03 DIAGNOSIS — S199XXA Unspecified injury of neck, initial encounter: Secondary | ICD-10-CM | POA: Diagnosis not present

## 2022-09-03 DIAGNOSIS — S299XXA Unspecified injury of thorax, initial encounter: Secondary | ICD-10-CM | POA: Diagnosis not present

## 2022-09-03 DIAGNOSIS — M25511 Pain in right shoulder: Secondary | ICD-10-CM

## 2022-09-03 DIAGNOSIS — M19011 Primary osteoarthritis, right shoulder: Secondary | ICD-10-CM | POA: Diagnosis not present

## 2022-09-03 DIAGNOSIS — I10 Essential (primary) hypertension: Secondary | ICD-10-CM | POA: Insufficient documentation

## 2022-09-03 DIAGNOSIS — M47812 Spondylosis without myelopathy or radiculopathy, cervical region: Secondary | ICD-10-CM | POA: Diagnosis not present

## 2022-09-03 DIAGNOSIS — M47814 Spondylosis without myelopathy or radiculopathy, thoracic region: Secondary | ICD-10-CM | POA: Diagnosis not present

## 2022-09-03 MED ORDER — OXYCODONE-ACETAMINOPHEN 5-325 MG PO TABS
1.0000 | ORAL_TABLET | Freq: Once | ORAL | Status: DC
  Filled 2022-09-03: qty 1

## 2022-09-03 MED ORDER — METHOCARBAMOL 500 MG PO TABS: 500.0000 mg | ORAL_TABLET | Freq: Two times a day (BID) | ORAL | 0 refills | Status: DC
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
## 2022-09-04 ENCOUNTER — Other Ambulatory Visit: Payer: Self-pay | Admitting: Internal Medicine

## 2022-09-15 ENCOUNTER — Other Ambulatory Visit: Payer: Self-pay | Admitting: Internal Medicine

## 2022-09-15 DIAGNOSIS — M170 Bilateral primary osteoarthritis of knee: Secondary | ICD-10-CM

## 2022-09-20 ENCOUNTER — Encounter: Payer: Self-pay | Admitting: Internal Medicine

## 2022-10-05 ENCOUNTER — Other Ambulatory Visit: Payer: Self-pay | Admitting: Internal Medicine

## 2022-10-05 DIAGNOSIS — K219 Gastro-esophageal reflux disease without esophagitis: Secondary | ICD-10-CM

## 2022-10-05 DIAGNOSIS — F320 Major depressive disorder, single episode, mild: Secondary | ICD-10-CM

## 2022-10-13 ENCOUNTER — Other Ambulatory Visit: Payer: Self-pay | Admitting: Internal Medicine

## 2022-10-13 DIAGNOSIS — M170 Bilateral primary osteoarthritis of knee: Secondary | ICD-10-CM

## 2022-11-12 ENCOUNTER — Other Ambulatory Visit: Payer: Self-pay | Admitting: Internal Medicine

## 2022-11-12 DIAGNOSIS — M170 Bilateral primary osteoarthritis of knee: Secondary | ICD-10-CM

## 2022-11-18 ENCOUNTER — Other Ambulatory Visit: Payer: Self-pay | Admitting: Internal Medicine

## 2022-11-18 DIAGNOSIS — Z1211 Encounter for screening for malignant neoplasm of colon: Secondary | ICD-10-CM

## 2022-11-18 DIAGNOSIS — Z1212 Encounter for screening for malignant neoplasm of rectum: Secondary | ICD-10-CM

## 2022-12-03 ENCOUNTER — Other Ambulatory Visit: Payer: Self-pay | Admitting: Internal Medicine

## 2022-12-12 ENCOUNTER — Other Ambulatory Visit: Payer: Self-pay | Admitting: Internal Medicine

## 2022-12-12 DIAGNOSIS — M170 Bilateral primary osteoarthritis of knee: Secondary | ICD-10-CM

## 2022-12-23 ENCOUNTER — Ambulatory Visit: Payer: Self-pay | Admitting: Internal Medicine

## 2022-12-23 NOTE — Telephone Encounter (Signed)
  Chief Complaint:  Seizure on Yesterday Symptoms:  Seizure Frequency:  Only one on yesterday. Pertinent Negatives: Patient denies  Disposition: [] ED /[] Urgent Care (no appt availability in office) / [x] Appointment(In office/virtual)/ []  [place] [facility]/ [] Home Care/ [] Refused Recommended Disposition /[] [place] [facility]/ []  Follow-up with PCP Additional Notes:  Pateitn had seizure on Reason for Disposition  [1] Seizure of unknown duration AND [2] history of prior seizure(s) AND [3] back to baseline with no new concerning symptoms  Answer Assessment - Initial Assessment Questions 1. ONSET: "When did the seizure occur?"     12/22/2022 2. DURATION: "How long did the seizure last (or how long has it been happening)?" (e.g., seconds, minutes)  Note: Most seizures last less than 5 minutes.     Doesn't know 3. DESCRIPTION: "Describe what happened during the seizure." "Did the body become stiff?" "Was there any jerking?"  "Did they lose consciousness during the seizure?"     Doesn't remember 4. CIRCUMSTANCE: "What was the person doing when the seizure began?"      *No Answer*Sitting  on the couch 5. MENTAL STATUS AFTER SEIZURE: "Does the person seem more groggy or sleepy?" "Does the person know who they are, who you are, and where they are now?"      *No Answer* 6. PRIOR SEIZURES: "Has the person had a seizure (convulsion) before?" (e.g., epilepsy, other cause)  If Yes, ask: "When was the last time?" and "What happened last time?"      It has been a while 7. EPILEPSY: "Does the person have epilepsy?" Note: Check for medical ID bracelet.     *No Answer*No 8. MEDICINES: "Does the person take anticonvulsant medications?" (e.g., Yes, No; missed doses, any recent changes)     *No Answer*No  9. INJURY: "Was the person hurt or injured during the seizure?" (e.g., hit their head, bit their tongue)     *No Answer*no 10. OTHER SYMPTOMS: "Are there any other symptoms?" (e.g., fever,  headache)       *No Answer* 11. PREGNANCY: "Is there any chance you are pregnant?" "When was your last menstrual period?"       *No Answer*  Protocols used: [facility]

## 2022-12-26 ENCOUNTER — Ambulatory Visit: Payer: Self-pay | Admitting: Internal Medicine

## 2022-12-26 NOTE — Telephone Encounter (Signed)
  Chief Complaint:  Seizure on Yesterday Symptoms:  Seizure Frequency:  Only one on yesterday. Pertinent Negatives: Patient denies  Disposition: [] ED /[] Urgent Care (no appt availability in office) / [x] Appointment(In office/virtual)/ []  [place] [facility]/ [] Home Care/ [] Refused Recommended Disposition /[] [place] [facility]/ []  Follow-up with PCP Additional Notes:  Pateitn had seizure on Reason for Disposition  [1] Seizure of unknown duration AND [2] history of prior seizure(s) AND [3] back to baseline with no new concerning symptoms  Answer Assessment - Initial Assessment Questions 1. ONSET: "When did the seizure occur?"     12/22/2022 2. DURATION: "How long did the seizure last (or how long has it been happening)?" (e.g., seconds, minutes)  Note: Most seizures last less than 5 minutes.     Doesn't know 3. DESCRIPTION: "Describe what happened during the seizure." "Did the body become stiff?" "Was there any jerking?"  "Did they lose consciousness during the seizure?"     Doesn't remember 4. CIRCUMSTANCE: "What was the person doing when the seizure began?"      *No Answer*Sitting  on the couch 5. MENTAL STATUS AFTER SEIZURE: "Does the person seem more groggy or sleepy?" "Does the person know who they are, who you are, and where they are now?"      *No Answer* 6. PRIOR SEIZURES: "Has the person had a seizure (convulsion) before?" (e.g., epilepsy, other cause)  If Yes, ask: "When was the last time?" and "What happened last time?"      It has been a while 7. EPILEPSY: "Does the person have epilepsy?" Note: Check for medical ID bracelet.     *No Answer*No 8. MEDICINES: "Does the person take anticonvulsant medications?" (e.g., Yes, No; missed doses, any recent changes)     *No Answer*No  9. INJURY: "Was the person hurt or injured during the seizure?" (e.g., hit their head, bit their tongue)     *No Answer*no 10. OTHER SYMPTOMS: "Are there any other symptoms?" (e.g., fever,  headache)       *No Answer* 11. PREGNANCY: "Is there any chance you are pregnant?" "When was your last menstrual period?"       *No Answer*  Protocols used: [facility]         Electronically signed by Paulita Cradle, Antuan L at 12/23/2022  4:48 PM

## 2023-01-04 ENCOUNTER — Other Ambulatory Visit: Payer: Self-pay | Admitting: Internal Medicine

## 2023-01-04 DIAGNOSIS — K219 Gastro-esophageal reflux disease without esophagitis: Secondary | ICD-10-CM

## 2023-01-09 ENCOUNTER — Other Ambulatory Visit: Payer: Self-pay | Admitting: Internal Medicine

## 2023-01-09 DIAGNOSIS — M170 Bilateral primary osteoarthritis of knee: Secondary | ICD-10-CM

## 2023-01-12 ENCOUNTER — Other Ambulatory Visit: Payer: Self-pay | Admitting: Internal Medicine

## 2023-01-12 DIAGNOSIS — F320 Major depressive disorder, single episode, mild: Secondary | ICD-10-CM

## 2023-02-05 ENCOUNTER — Other Ambulatory Visit: Payer: Self-pay | Admitting: Internal Medicine

## 2023-02-05 DIAGNOSIS — M170 Bilateral primary osteoarthritis of knee: Secondary | ICD-10-CM

## 2023-02-06 ENCOUNTER — Other Ambulatory Visit: Payer: Self-pay | Admitting: Internal Medicine

## 2023-02-06 DIAGNOSIS — M170 Bilateral primary osteoarthritis of knee: Secondary | ICD-10-CM

## 2023-03-04 ENCOUNTER — Other Ambulatory Visit: Payer: Self-pay | Admitting: Internal Medicine

## 2023-04-03 ENCOUNTER — Other Ambulatory Visit: Payer: Self-pay | Admitting: Internal Medicine

## 2023-04-03 DIAGNOSIS — K219 Gastro-esophageal reflux disease without esophagitis: Secondary | ICD-10-CM

## 2023-04-11 ENCOUNTER — Other Ambulatory Visit: Payer: Self-pay | Admitting: Internal Medicine

## 2023-04-11 DIAGNOSIS — F320 Major depressive disorder, single episode, mild: Secondary | ICD-10-CM

## 2023-04-16 ENCOUNTER — Other Ambulatory Visit: Payer: Self-pay | Admitting: Internal Medicine

## 2023-04-16 DIAGNOSIS — M170 Bilateral primary osteoarthritis of knee: Secondary | ICD-10-CM

## 2023-05-12 ENCOUNTER — Other Ambulatory Visit: Payer: Self-pay | Admitting: Internal Medicine

## 2023-05-12 DIAGNOSIS — M170 Bilateral primary osteoarthritis of knee: Secondary | ICD-10-CM

## 2023-05-14 ENCOUNTER — Other Ambulatory Visit: Payer: Self-pay | Admitting: Internal Medicine

## 2023-05-14 DIAGNOSIS — M170 Bilateral primary osteoarthritis of knee: Secondary | ICD-10-CM

## 2023-06-02 ENCOUNTER — Other Ambulatory Visit: Payer: Self-pay | Admitting: Internal Medicine

## 2023-06-11 ENCOUNTER — Other Ambulatory Visit: Payer: Self-pay | Admitting: Internal Medicine

## 2023-06-11 DIAGNOSIS — M170 Bilateral primary osteoarthritis of knee: Secondary | ICD-10-CM

## 2023-07-02 ENCOUNTER — Other Ambulatory Visit: Payer: Self-pay | Admitting: Internal Medicine

## 2023-07-02 DIAGNOSIS — K219 Gastro-esophageal reflux disease without esophagitis: Secondary | ICD-10-CM

## 2023-07-09 ENCOUNTER — Other Ambulatory Visit: Payer: Self-pay | Admitting: Internal Medicine

## 2023-07-09 DIAGNOSIS — F320 Major depressive disorder, single episode, mild: Secondary | ICD-10-CM

## 2023-07-10 ENCOUNTER — Other Ambulatory Visit: Payer: Self-pay | Admitting: Internal Medicine

## 2023-07-10 DIAGNOSIS — M170 Bilateral primary osteoarthritis of knee: Secondary | ICD-10-CM

## 2023-07-13 ENCOUNTER — Ambulatory Visit: Admitting: Internal Medicine

## 2023-07-21 ENCOUNTER — Encounter: Payer: Self-pay | Admitting: Internal Medicine

## 2023-07-28 ENCOUNTER — Ambulatory Visit (INDEPENDENT_AMBULATORY_CARE_PROVIDER_SITE_OTHER): Admitting: Family Medicine

## 2023-07-28 VITALS — BP 120/79 | HR 87 | Ht 65.0 in | Wt 342.6 lb

## 2023-07-28 DIAGNOSIS — M170 Bilateral primary osteoarthritis of knee: Secondary | ICD-10-CM

## 2023-07-28 DIAGNOSIS — R7301 Impaired fasting glucose: Secondary | ICD-10-CM

## 2023-07-28 DIAGNOSIS — F321 Major depressive disorder, single episode, moderate: Secondary | ICD-10-CM | POA: Diagnosis not present

## 2023-07-28 DIAGNOSIS — Z1231 Encounter for screening mammogram for malignant neoplasm of breast: Secondary | ICD-10-CM

## 2023-07-28 DIAGNOSIS — E782 Mixed hyperlipidemia: Secondary | ICD-10-CM

## 2023-07-28 DIAGNOSIS — Z1211 Encounter for screening for malignant neoplasm of colon: Secondary | ICD-10-CM

## 2023-07-28 DIAGNOSIS — E559 Vitamin D deficiency, unspecified: Secondary | ICD-10-CM

## 2023-07-28 DIAGNOSIS — E038 Other specified hypothyroidism: Secondary | ICD-10-CM

## 2023-07-28 DIAGNOSIS — I10 Essential (primary) hypertension: Secondary | ICD-10-CM | POA: Diagnosis not present

## 2023-07-28 MED ORDER — LAMOTRIGINE 25 MG PO TABS: 25.0000 mg | ORAL_TABLET | Freq: Every day | ORAL | 3 refills | Status: DC

## 2023-07-28 MED ORDER — MELOXICAM 7.5 MG PO TABS: 7.5000 mg | ORAL_TABLET | Freq: Every day | ORAL | 3 refills | Status: DC | PRN

## 2023-07-28 MED ORDER — ESCITALOPRAM OXALATE 20 MG PO TABS
20.0000 mg | ORAL_TABLET | Freq: Every day | ORAL | 0 refills | Status: DC
Start: 2023-07-28 — End: 2023-10-23

## 2023-07-28 NOTE — Assessment & Plan Note (Signed)
 PHQ-9 score 18 The patient has been without Lexapro  20 mg for the past 3 weeks. A refill has been sent. Lamotrigine 25 mg once daily has been added to the regimen. Follow-up is scheduled in 8 weeks. Patient refused therapy  We discussed several non-pharmacological approaches to managing anxiety and depression, including:  Establishing a consistent daily routine: This helps create structure and stability. Practicing mindfulness and relaxation techniques: Incorporating meditation, deep breathing exercises, or yoga to manage stress and improve emotional well-being. Engaging in regular physical activity: Aim for at least 30 minutes of exercise most days to boost mood and energy levels. Spending time outdoors: Exposure to natural light and fresh air can improve mental health. Building a support network: Encouraging social connections with friends, family, or support groups to reduce feelings of isolation. Prioritizing a balanced diet: Eating nutrient-rich foods while avoiding excessive amounts of processed foods, sugar, and unhealthy fats. Follow-up is recommended in 4-8 weeks to assess progress, with a referral to behavioral health for further support if needed.

## 2023-07-28 NOTE — Progress Notes (Signed)
 Established Patient Office Visit   Subjective  Patient ID: Doris Miller, female    DOB: 1960/02/17  Age: 63 y.o. MRN: [id number]  Chief Complaint  Patient presents with   Medication Refill    Lexapro , meloxicam      She  has a past medical history of Arthritis, Bronchospasm (03/12/2019), Depression, Encounter for hepatitis C screening test for low risk patient (03/12/2019), Encounter for screening for HIV (03/12/2019), Encounter for screening for malignant neoplasm of colon (03/12/2019), Encounter for screening mammogram for malignant neoplasm of breast (03/12/2019), GERD (gastroesophageal reflux disease), Hypertension, Low back pain radiating to right leg (10/25/2021), Lymphedema, and OA (osteoarthritis) of knee right.  PHQ-9 Score: 18 - Moderate to Severe Depression The patient presents with chronic depression, which has been progressively worsening over time. Symptoms are persistent and include fatigue, feelings of helplessness and hopelessness, insomnia, restlessness, diminished interest in activities, frequent headaches, and a pervasive sense of sadness. The patient denies any suicidal ideation at this time. Symptoms are exacerbated by ongoing family and social stressors. The patient has previously been treated with SSRIs, specifically Lexapro , and has demonstrated good adherence to the medication regimen. However, previous treatment provided only mild symptom relief. Risk factors for depression include a family history of mental illness, exposure to major life stressors, prior traumatic experiences, chronic stress, and a personal history of depression and chronic pain.     Review of Systems  Constitutional:  Negative for chills and fever.  Eyes:  Negative for blurred vision.  Respiratory:  Negative for shortness of breath.   Cardiovascular:  Negative for chest pain.  Genitourinary:  Negative for dysuria.  Neurological:  Positive for headaches. Negative for dizziness.   Psychiatric/Behavioral:  Positive for depression. Negative for suicidal ideas. The patient has insomnia.       Objective:     BP 120/79 (BP Location: Right Arm, Patient Position: Sitting, Cuff Size: Large)   Pulse 87   Ht 5' 5 (1.651 m)   Wt (!) 342 lb 9.6 oz (155.4 kg)   LMP 06/13/2011   BMI 57.01 kg/m  BP Readings from Last 3 Encounters:  07/28/23 120/79  09/03/22 110/89  03/07/22 123/71      Physical Exam Vitals reviewed.  Constitutional:      General: She is not in acute distress.    Appearance: Normal appearance. She is obese. She is not ill-appearing, toxic-appearing or diaphoretic.  HENT:     Head: Normocephalic.   Eyes:     General:        Right eye: No discharge.        Left eye: No discharge.     Conjunctiva/sclera: Conjunctivae normal.    Cardiovascular:     Rate and Rhythm: Normal rate.     Pulses: Normal pulses.     Heart sounds: Normal heart sounds.  Pulmonary:     Effort: Pulmonary effort is normal. No respiratory distress.     Breath sounds: Normal breath sounds.  Abdominal:     General: Bowel sounds are normal.     Palpations: Abdomen is soft.     Tenderness: There is no abdominal tenderness. There is no guarding.   Skin:    General: Skin is warm and dry.   Neurological:     Mental Status: She is alert.   Psychiatric:        Mood and Affect: Mood normal.        Behavior: Behavior normal.        Thought Content:  Thought content normal.      No results found for any visits on 07/28/23.  The 10-year ASCVD risk score (Arnett DK, et al., 2019) is: 3.2%    Assessment & Plan:  Depression, major, single episode, moderate (HCC) Assessment & Plan: PHQ-9 score 18 The patient has been without Lexapro  20 mg for the past 3 weeks. A refill has been sent. Lamotrigine 25 mg once daily has been added to the regimen. Follow-up is scheduled in 8 weeks. Patient refused therapy  We discussed several non-pharmacological approaches to managing  anxiety and depression, including:  Establishing a consistent daily routine: This helps create structure and stability. Practicing mindfulness and relaxation techniques: Incorporating meditation, deep breathing exercises, or yoga to manage stress and improve emotional well-being. Engaging in regular physical activity: Aim for at least 30 minutes of exercise most days to boost mood and energy levels. Spending time outdoors: Exposure to natural light and fresh air can improve mental health. Building a support network: Encouraging social connections with friends, family, or support groups to reduce feelings of isolation. Prioritizing a balanced diet: Eating nutrient-rich foods while avoiding excessive amounts of processed foods, sugar, and unhealthy fats. Follow-up is recommended in 4-8 weeks to assess progress, with a referral to behavioral health for further support if needed.   Orders: -     Escitalopram  Oxalate; Take 1 tablet (20 mg total) by mouth daily.  Dispense: 90 tablet; Refill: 0 -     lamoTRIgine; Take 1 tablet (25 mg total) by mouth daily.  Dispense: 30 tablet; Refill: 3  Screening mammogram for breast cancer  Vitamin D  deficiency -     VITAMIN D  25 Hydroxy (Vit-D Deficiency, Fractures)  Mixed hyperlipidemia -     Lipid panel -     CMP14+EGFR -     CBC with Differential/Platelet  TSH (thyroid -stimulating hormone deficiency) -     TSH + free T4  IFG (impaired fasting glucose) -     Hemoglobin A1c  Screen for colon cancer -     Cologuard  Osteoarthritis of both knees, unspecified osteoarthritis type -     Meloxicam ; Take 1 tablet (7.5 mg total) by mouth daily as needed for pain.  Dispense: 30 tablet; Refill: 3  Primary hypertension Assessment & Plan: Vitals:   07/28/23 1521  BP: 120/79  Continue lisinopril -HCTZ 10-12.5 mg daily  Labs ordered. Discussed with  patient to monitor their blood pressure regularly and maintain a heart-healthy diet rich in fruits,  vegetables, whole grains, and low-fat dairy, while reducing sodium intake to less than 2,300 mg per day. Regular physical activity, such as 30 minutes of moderate exercise most days of the week, will help lower blood pressure and improve overall cardiovascular health. Avoiding smoking, limiting alcohol consumption, and managing stress. Take  prescribed medication, & take it as directed and avoid skipping doses. Seek emergency care if your blood pressure is (over 180/100) or you experience chest pain, shortness of breath, or sudden vision changes.Patient verbalizes understanding regarding plan of care and all questions answered.      Return in 8 weeks (on 09/22/2023), or if symptoms worsen or fail to improve, for Depression, medication managment, migraines .   Avelino Lek Amber Bail, FNP

## 2023-07-28 NOTE — Patient Instructions (Signed)

## 2023-07-28 NOTE — Assessment & Plan Note (Signed)
 Vitals:   07/28/23 1521  BP: 120/79  Continue lisinopril -HCTZ 10-12.5 mg daily  Labs ordered. Discussed with  patient to monitor their blood pressure regularly and maintain a heart-healthy diet rich in fruits, vegetables, whole grains, and low-fat dairy, while reducing sodium intake to less than 2,300 mg per day. Regular physical activity, such as 30 minutes of moderate exercise most days of the week, will help lower blood pressure and improve overall cardiovascular health. Avoiding smoking, limiting alcohol consumption, and managing stress. Take  prescribed medication, & take it as directed and avoid skipping doses. Seek emergency care if your blood pressure is (over 180/100) or you experience chest pain, shortness of breath, or sudden vision changes.Patient verbalizes understanding regarding plan of care and all questions answered.

## 2023-07-29 LAB — HEMOGLOBIN A1C
Est. average glucose Bld gHb Est-mCnc: 117 mg/dL
Hgb A1c MFr Bld: 5.7 % — ABNORMAL HIGH (ref 4.8–5.6)

## 2023-07-29 LAB — CBC WITH DIFFERENTIAL/PLATELET
Basophils Absolute: 0 10*3/uL (ref 0.0–0.2)
Basos: 1 %
EOS (ABSOLUTE): 0.1 10*3/uL (ref 0.0–0.4)
Eos: 2 %
Hematocrit: 41.9 % (ref 34.0–46.6)
Hemoglobin: 13.2 g/dL (ref 11.1–15.9)
Immature Grans (Abs): 0 10*3/uL (ref 0.0–0.1)
Immature Granulocytes: 0 %
Lymphocytes Absolute: 2.2 10*3/uL (ref 0.7–3.1)
Lymphs: 41 %
MCH: 26.7 pg (ref 26.6–33.0)
MCHC: 31.5 g/dL (ref 31.5–35.7)
MCV: 85 fL (ref 79–97)
Monocytes Absolute: 0.3 10*3/uL (ref 0.1–0.9)
Monocytes: 6 %
Neutrophils Absolute: 2.7 10*3/uL (ref 1.4–7.0)
Neutrophils: 50 %
Platelets: 324 10*3/uL (ref 150–450)
RBC: 4.94 x10E6/uL (ref 3.77–5.28)
RDW: 14.6 % (ref 11.7–15.4)
WBC: 5.4 10*3/uL (ref 3.4–10.8)

## 2023-07-29 LAB — CMP14+EGFR
ALT: 10 IU/L (ref 0–32)
AST: 8 IU/L (ref 0–40)
Albumin: 3.8 g/dL (ref 3.8–4.9)
Alkaline Phosphatase: 90 IU/L (ref 44–121)
BUN/Creatinine Ratio: 17 (ref 12–28)
BUN: 23 mg/dL (ref 8–27)
Bilirubin Total: 0.4 mg/dL (ref 0.0–1.2)
CO2: 22 mmol/L (ref 20–29)
Calcium: 10.3 mg/dL (ref 8.7–10.3)
Chloride: 104 mmol/L (ref 96–106)
Creatinine, Ser: 1.39 mg/dL — ABNORMAL HIGH (ref 0.57–1.00)
Globulin, Total: 2.7 g/dL (ref 1.5–4.5)
Glucose: 87 mg/dL (ref 70–99)
Potassium: 4.1 mmol/L (ref 3.5–5.2)
Sodium: 140 mmol/L (ref 134–144)
Total Protein: 6.5 g/dL (ref 6.0–8.5)
eGFR: 43 mL/min/{1.73_m2} — ABNORMAL LOW (ref >59)

## 2023-07-29 LAB — TSH+FREE T4
Free T4: 1.4 ng/dL (ref 0.82–1.77)
TSH: 0.412 u[IU]/mL — ABNORMAL LOW (ref 0.450–4.500)

## 2023-07-29 LAB — LIPID PANEL
Chol/HDL Ratio: 2.8 ratio (ref 0.0–4.4)
Cholesterol, Total: 160 mg/dL (ref 100–199)
HDL: 58 mg/dL (ref >39)
LDL Chol Calc (NIH): 84 mg/dL (ref 0–99)
Triglycerides: 97 mg/dL (ref 0–149)
VLDL Cholesterol Cal: 18 mg/dL (ref 5–40)

## 2023-07-29 LAB — VITAMIN D 25 HYDROXY (VIT D DEFICIENCY, FRACTURES): Vit D, 25-Hydroxy: 50.1 ng/mL (ref 30.0–100.0)

## 2023-08-08 ENCOUNTER — Ambulatory Visit: Payer: Self-pay | Admitting: Family Medicine

## 2023-08-26 ENCOUNTER — Other Ambulatory Visit: Payer: Self-pay | Admitting: Internal Medicine

## 2023-09-29 ENCOUNTER — Other Ambulatory Visit: Payer: Self-pay | Admitting: Internal Medicine

## 2023-09-29 DIAGNOSIS — K219 Gastro-esophageal reflux disease without esophagitis: Secondary | ICD-10-CM

## 2023-10-21 ENCOUNTER — Other Ambulatory Visit: Payer: Self-pay | Admitting: Family Medicine

## 2023-10-21 DIAGNOSIS — F321 Major depressive disorder, single episode, moderate: Secondary | ICD-10-CM

## 2023-11-22 ENCOUNTER — Other Ambulatory Visit: Payer: Self-pay | Admitting: Family Medicine

## 2023-11-27 ENCOUNTER — Other Ambulatory Visit: Payer: Self-pay | Admitting: Family Medicine

## 2023-11-27 DIAGNOSIS — M170 Bilateral primary osteoarthritis of knee: Secondary | ICD-10-CM

## 2023-11-27 DIAGNOSIS — F321 Major depressive disorder, single episode, moderate: Secondary | ICD-10-CM

## 2023-12-25 ENCOUNTER — Ambulatory Visit: Admitting: Family Medicine

## 2024-01-02 ENCOUNTER — Other Ambulatory Visit: Payer: Self-pay | Admitting: Family Medicine

## 2024-01-02 DIAGNOSIS — F321 Major depressive disorder, single episode, moderate: Secondary | ICD-10-CM

## 2024-01-02 DIAGNOSIS — K219 Gastro-esophageal reflux disease without esophagitis: Secondary | ICD-10-CM

## 2024-01-22 ENCOUNTER — Other Ambulatory Visit: Payer: Self-pay | Admitting: Family Medicine

## 2024-01-22 DIAGNOSIS — F321 Major depressive disorder, single episode, moderate: Secondary | ICD-10-CM

## 2024-01-31 ENCOUNTER — Other Ambulatory Visit: Payer: Self-pay | Admitting: Family Medicine

## 2024-01-31 DIAGNOSIS — F321 Major depressive disorder, single episode, moderate: Secondary | ICD-10-CM

## 2024-02-29 ENCOUNTER — Encounter (HOSPITAL_COMMUNITY): Payer: Self-pay

## 2024-02-29 ENCOUNTER — Other Ambulatory Visit: Payer: Self-pay

## 2024-02-29 ENCOUNTER — Emergency Department (HOSPITAL_COMMUNITY)

## 2024-02-29 ENCOUNTER — Inpatient Hospital Stay (HOSPITAL_COMMUNITY)
Admission: EM | Admit: 2024-02-29 | Discharge: 2024-03-04 | DRG: 683 | Disposition: A | Discharge status: Home / Self Care | Attending: Internal Medicine | Admitting: Internal Medicine

## 2024-02-29 DIAGNOSIS — Z825 Family history of asthma and other chronic lower respiratory diseases: Secondary | ICD-10-CM

## 2024-02-29 DIAGNOSIS — K449 Diaphragmatic hernia without obstruction or gangrene: Secondary | ICD-10-CM | POA: Diagnosis present

## 2024-02-29 DIAGNOSIS — R0601 Orthopnea: Secondary | ICD-10-CM | POA: Diagnosis present

## 2024-02-29 DIAGNOSIS — L03119 Cellulitis of unspecified part of limb: Secondary | ICD-10-CM | POA: Diagnosis present

## 2024-02-29 DIAGNOSIS — R600 Localized edema: Secondary | ICD-10-CM

## 2024-02-29 DIAGNOSIS — E872 Acidosis, unspecified: Secondary | ICD-10-CM | POA: Diagnosis present

## 2024-02-29 DIAGNOSIS — R0602 Shortness of breath: Secondary | ICD-10-CM | POA: Diagnosis present

## 2024-02-29 DIAGNOSIS — Z8049 Family history of malignant neoplasm of other genital organs: Secondary | ICD-10-CM

## 2024-02-29 DIAGNOSIS — D649 Anemia, unspecified: Secondary | ICD-10-CM | POA: Diagnosis present

## 2024-02-29 DIAGNOSIS — I10 Essential (primary) hypertension: Secondary | ICD-10-CM | POA: Diagnosis present

## 2024-02-29 DIAGNOSIS — Z803 Family history of malignant neoplasm of breast: Secondary | ICD-10-CM

## 2024-02-29 DIAGNOSIS — Z6841 Body Mass Index (BMI) 40.0 and over, adult: Secondary | ICD-10-CM

## 2024-02-29 DIAGNOSIS — Z791 Long term (current) use of non-steroidal anti-inflammatories (NSAID): Secondary | ICD-10-CM

## 2024-02-29 DIAGNOSIS — Z1152 Encounter for screening for COVID-19: Secondary | ICD-10-CM

## 2024-02-29 DIAGNOSIS — N179 Acute kidney failure, unspecified: Principal | ICD-10-CM | POA: Diagnosis present

## 2024-02-29 DIAGNOSIS — I890 Lymphedema, not elsewhere classified: Secondary | ICD-10-CM | POA: Diagnosis present

## 2024-02-29 DIAGNOSIS — L03116 Cellulitis of left lower limb: Secondary | ICD-10-CM | POA: Diagnosis present

## 2024-02-29 DIAGNOSIS — M170 Bilateral primary osteoarthritis of knee: Secondary | ICD-10-CM | POA: Diagnosis present

## 2024-02-29 DIAGNOSIS — M7989 Other specified soft tissue disorders: Secondary | ICD-10-CM | POA: Diagnosis present

## 2024-02-29 DIAGNOSIS — L03115 Cellulitis of right lower limb: Secondary | ICD-10-CM | POA: Diagnosis present

## 2024-02-29 DIAGNOSIS — Z820 Family history of epilepsy and other diseases of the nervous system: Secondary | ICD-10-CM

## 2024-02-29 DIAGNOSIS — K219 Gastro-esophageal reflux disease without esophagitis: Secondary | ICD-10-CM | POA: Diagnosis present

## 2024-02-29 DIAGNOSIS — F32A Depression, unspecified: Secondary | ICD-10-CM | POA: Diagnosis present

## 2024-02-29 DIAGNOSIS — Z888 Allergy status to other drugs, medicaments and biological substances status: Secondary | ICD-10-CM

## 2024-02-29 DIAGNOSIS — Z79899 Other long term (current) drug therapy: Secondary | ICD-10-CM

## 2024-02-29 DIAGNOSIS — F419 Anxiety disorder, unspecified: Secondary | ICD-10-CM | POA: Diagnosis present

## 2024-02-29 DIAGNOSIS — Z8249 Family history of ischemic heart disease and other diseases of the circulatory system: Secondary | ICD-10-CM

## 2024-02-29 DIAGNOSIS — F321 Major depressive disorder, single episode, moderate: Secondary | ICD-10-CM

## 2024-02-29 DIAGNOSIS — Z885 Allergy status to narcotic agent status: Secondary | ICD-10-CM

## 2024-02-29 LAB — COMPREHENSIVE METABOLIC PANEL WITH GFR
ALT: 11 U/L (ref 0–44)
AST: 14 U/L — ABNORMAL LOW (ref 15–41)
Albumin: 3.2 g/dL — ABNORMAL LOW (ref 3.5–5.0)
Alkaline Phosphatase: 195 U/L — ABNORMAL HIGH (ref 38–126)
Anion gap: 16 — ABNORMAL HIGH (ref 5–15)
BUN: 85 mg/dL — ABNORMAL HIGH (ref 8–23)
CO2: 15 mmol/L — ABNORMAL LOW (ref 22–32)
Calcium: 9.8 mg/dL (ref 8.9–10.3)
Chloride: 109 mmol/L (ref 98–111)
Creatinine, Ser: 2.97 mg/dL — ABNORMAL HIGH (ref 0.44–1.00)
GFR, Estimated: 17 mL/min — ABNORMAL LOW
Glucose, Bld: 93 mg/dL (ref 70–99)
Potassium: 4.1 mmol/L (ref 3.5–5.1)
Sodium: 139 mmol/L (ref 135–145)
Total Bilirubin: 0.2 mg/dL (ref 0.0–1.2)
Total Protein: 6.7 g/dL (ref 6.5–8.1)

## 2024-02-29 LAB — TROPONIN T, HIGH SENSITIVITY
Troponin T High Sensitivity: <15 ng/L (ref 0–19)
Troponin T High Sensitivity: <15 ng/L (ref 0–19)

## 2024-02-29 LAB — CBC WITH DIFFERENTIAL/PLATELET
Abs Immature Granulocytes: 0.2 K/uL — ABNORMAL HIGH (ref 0.00–0.07)
Basophils Absolute: 0.1 K/uL (ref 0.0–0.1)
Basophils Relative: 1 %
Eosinophils Absolute: 0 K/uL (ref 0.0–0.5)
Eosinophils Relative: 0 %
HCT: 32 % — ABNORMAL LOW (ref 36.0–46.0)
Hemoglobin: 9.4 g/dL — ABNORMAL LOW (ref 12.0–15.0)
Immature Granulocytes: 2 %
Lymphocytes Relative: 13 %
Lymphs Abs: 1.8 K/uL (ref 0.7–4.0)
MCH: 25.1 pg — ABNORMAL LOW (ref 26.0–34.0)
MCHC: 29.4 g/dL — ABNORMAL LOW (ref 30.0–36.0)
MCV: 85.6 fL (ref 80.0–100.0)
Monocytes Absolute: 0.9 K/uL (ref 0.1–1.0)
Monocytes Relative: 6 %
Neutro Abs: 10.7 K/uL — ABNORMAL HIGH (ref 1.7–7.7)
Neutrophils Relative %: 78 %
Platelets: 419 K/uL — ABNORMAL HIGH (ref 150–400)
RBC: 3.74 MIL/uL — ABNORMAL LOW (ref 3.87–5.11)
RDW: 19.1 % — ABNORMAL HIGH (ref 11.5–15.5)
WBC: 13.7 K/uL — ABNORMAL HIGH (ref 4.0–10.5)
nRBC: 0 % (ref 0.0–0.2)

## 2024-02-29 LAB — RESP PANEL BY RT-PCR (RSV, FLU A&B, COVID)  RVPGX2
Influenza A by PCR: NEGATIVE
Influenza B by PCR: NEGATIVE
Resp Syncytial Virus by PCR: NEGATIVE
SARS Coronavirus 2 by RT PCR: NEGATIVE

## 2024-02-29 LAB — I-STAT CG4 LACTIC ACID, ED: Lactic Acid, Venous: 1.4 mmol/L (ref 0.5–1.9)

## 2024-02-29 LAB — PRO BRAIN NATRIURETIC PEPTIDE: Pro Brain Natriuretic Peptide: 119 pg/mL

## 2024-02-29 MED ORDER — SODIUM CHLORIDE 0.9 % IV BOLUS
1000.0000 mL | Freq: Once | INTRAVENOUS | Status: AC
  Administered 2024-03-01: 1000 mL via INTRAVENOUS

## 2024-02-29 NOTE — ED Provider Notes (Incomplete)
 " [facility] Provider Note   CSN: [id number] Arrival date & time: 02/29/24  1827     Patient presents with: Shortness of Breath, Leg Swelling, and Orthopnea   Doris Miller is a 64 y.o. female.  Patient with past medical history significant for morbid obesity, GERD, cellulitis of the left lower leg, lymphedema presents to the emergency department complaining of bilateral lower leg swelling with weeping edema.  Patient also reports shortness of breath when lying flat, generalized body aches/chills for the past 2 weeks.  She denies specific chest pain/shortness of breath except for the orthopnea.  Patient denies abdominal pain, nausea, vomiting, diarrhea.  She does endorse decreased fluid intake over the past few weeks.  {Add pertinent medical, surgical, social history, OB history to HPI:32947}  Shortness of Breath      Prior to Admission medications  Medication Sig Start Date End Date Taking? Authorizing Provider  albuterol  (VENTOLIN  HFA) 108 (90 Base) MCG/ACT inhaler Inhale 1-2 puffs into the lungs every 6 (six) hours as needed for wheezing or shortness of breath. 03/08/19   Moishe Chiquita HERO, NP  benzonatate  (TESSALON ) 200 MG capsule Take 1 capsule (200 mg total) by mouth 3 (three) times daily as needed for up to 30 doses for cough. 07/05/22   Melvenia Manus BRAVO, MD  Cholecalciferol (VITAMIN D3) 25 MCG (1000 UT) CAPS Take 1 capsule (1,000 Units total) by mouth daily. 03/26/21   Paseda, Folashade R, FNP  escitalopram  (LEXAPRO ) 20 MG tablet Take 1 tablet by mouth once daily 01/22/24   Del Orbe Polanco, Iliana, FNP  lamoTRIgine  (LAMICTAL ) 25 MG tablet Take 1 tablet by mouth once daily 01/31/24   Del Orbe Polanco, Iliana, FNP  lisinopril -hydrochlorothiazide  (ZESTORETIC ) 10-12.5 MG tablet Take 1 tablet by mouth once daily 11/22/23   Del Orbe Polanco, Iliana, FNP  meloxicam  (MOBIC ) 7.5 MG tablet TAKE 1 TABLET BY MOUTH ONCE DAILY AS NEEDED FOR PAIN 11/28/23    Del Orbe Polanco, Iliana, FNP  methocarbamol  (ROBAXIN ) 500 MG tablet Take 1 tablet (500 mg total) by mouth 2 (two) times daily. 09/03/22   Schutt, Marsa HERO, PA-C  omeprazole  (PRILOSEC) 20 MG capsule Take 1 capsule by mouth once daily 01/02/24   Del Orbe Polanco, Iliana, FNP    Allergies: Codeine and Diclofenac    Review of Systems  Respiratory:  Positive for shortness of breath.     Updated Vital Signs BP (!) 130/117 (BP Location: Left Arm)   Pulse (!) 102   Temp (!) 97.3 F (36.3 C) (Oral)   Resp 15   Ht 5' 5 (1.651 m)   Wt (!) 150.1 kg   LMP 06/13/2011   SpO2 100%   BMI 55.08 kg/m   Physical Exam Vitals and nursing note reviewed.  Constitutional:      General: She is not in acute distress.    Appearance: She is well-developed. She is obese.  HENT:     Head: Normocephalic and atraumatic.  Eyes:     Conjunctiva/sclera: Conjunctivae normal.  Cardiovascular:     Rate and Rhythm: Normal rate and regular rhythm.     Heart sounds: No murmur heard. Pulmonary:     Effort: Pulmonary effort is normal. No respiratory distress.     Breath sounds: Normal breath sounds.  Abdominal:     Palpations: Abdomen is soft.     Tenderness: There is no abdominal tenderness.  Musculoskeletal:        General: No swelling.  Cervical back: Neck supple.     Right lower leg: Edema present.     Left lower leg: Edema present.     Comments: Bilateral lower extremities with weeping edema, some bilateral erythema  Skin:    General: Skin is warm and dry.     Capillary Refill: Capillary refill takes less than 2 seconds.  Neurological:     Mental Status: She is alert.  Psychiatric:        Mood and Affect: Mood normal.     (all labs ordered are listed, but only abnormal results are displayed) Labs Reviewed  COMPREHENSIVE METABOLIC PANEL WITH GFR - Abnormal; Notable for the following components:      Result Value   CO2 15 (*)    BUN 85 (*)    Creatinine, Ser 2.97 (*)    Albumin 3.2  (*)    AST 14 (*)    Alkaline Phosphatase 195 (*)    GFR, Estimated 17 (*)    Anion gap 16 (*)    All other components within normal limits  CBC WITH DIFFERENTIAL/PLATELET - Abnormal; Notable for the following components:   WBC 13.7 (*)    RBC 3.74 (*)    Hemoglobin 9.4 (*)    HCT 32.0 (*)    MCH 25.1 (*)    MCHC 29.4 (*)    RDW 19.1 (*)    Platelets 419 (*)    Neutro Abs 10.7 (*)    Abs Immature Granulocytes 0.20 (*)    All other components within normal limits  RESP PANEL BY RT-PCR (RSV, FLU A&B, COVID)  RVPGX2  PRO BRAIN NATRIURETIC PEPTIDE  I-STAT CG4 LACTIC ACID, ED  TROPONIN T, HIGH SENSITIVITY  TROPONIN T, HIGH SENSITIVITY    EKG: None  Radiology: DG Tibia/Fibula Left Result Date: 02/29/2024 CLINICAL DATA:  Bilateral lower extremity edema, cellulitis EXAM: LEFT TIBIA AND FIBULA - 2 VIEW; RIGHT TIBIA AND FIBULA - 2 VIEW COMPARISON:  12/09/2003 FINDINGS: Left tibia/fibula: Frontal and lateral views are obtained. There is severe diffuse subcutaneous edema. No subcutaneous gas or radiopaque foreign body. There are no acute or destructive bony abnormalities. Moderate 3 compartmental left knee osteoarthritis. Right tibia/fibula: Frontal and lateral views are obtained. There is severe diffuse subcutaneous edema. No subcutaneous gas or radiopaque foreign body. There are no acute or destructive bony abnormalities. Moderate 3 compartmental right knee osteoarthritis. IMPRESSION: 1. Diffuse subcutaneous edema throughout the bilateral lower extremities compatible with given history of cellulitis. No subcutaneous gas or radiopaque foreign body. 2. No acute or destructive bony abnormalities. 3. Osteoarthritis of the bilateral knees. Electronically Signed   By: Ozell Daring M.D.   On: 02/29/2024 20:07   DG Tibia/Fibula Right Result Date: 02/29/2024 CLINICAL DATA:  Bilateral lower extremity edema, cellulitis EXAM: LEFT TIBIA AND FIBULA - 2 VIEW; RIGHT TIBIA AND FIBULA - 2 VIEW COMPARISON:   12/09/2003 FINDINGS: Left tibia/fibula: Frontal and lateral views are obtained. There is severe diffuse subcutaneous edema. No subcutaneous gas or radiopaque foreign body. There are no acute or destructive bony abnormalities. Moderate 3 compartmental left knee osteoarthritis. Right tibia/fibula: Frontal and lateral views are obtained. There is severe diffuse subcutaneous edema. No subcutaneous gas or radiopaque foreign body. There are no acute or destructive bony abnormalities. Moderate 3 compartmental right knee osteoarthritis. IMPRESSION: 1. Diffuse subcutaneous edema throughout the bilateral lower extremities compatible with given history of cellulitis. No subcutaneous gas or radiopaque foreign body. 2. No acute or destructive bony abnormalities. 3. Osteoarthritis of the bilateral knees. Electronically  Signed   By: Ozell Daring M.D.   On: 02/29/2024 20:07   DG Chest 2 View Result Date: 02/29/2024 CLINICAL DATA:  Shortness of breath, cellulitis, lower extremity edema EXAM: CHEST - 2 VIEW COMPARISON:  06/15/2011 FINDINGS: Frontal and lateral views of the chest demonstrate an unremarkable cardiac silhouette. Large hiatal hernia. No airspace disease, effusion, or pneumothorax. No acute bony abnormalities. IMPRESSION: 1. No acute intrathoracic process. 2. Large hiatal hernia. Electronically Signed   By: Ozell Daring M.D.   On: 02/29/2024 20:06    {Document cardiac monitor, telemetry assessment procedure when appropriate:32947} Procedures   Medications Ordered in the ED  sodium chloride  0.9 % bolus 1,000 mL (has no administration in time range)      {Click here for ABCD2, HEART and other calculators REFRESH Note before signing:1}                              Medical Decision Making  This patient presents to the ED for concern of bilateral leg swelling, this involves an extensive number of treatment options, and is a complaint that carries with it a high risk of complications and morbidity.  The  differential diagnosis includes venous stasis, lymphedema, cellulitis, DVT, others.  Patient also complains of shortness of breath with lying down.  Differential includes right-sided heart failure, OSA, others   Co morbidities / Chronic conditions that complicate the patient evaluation  As noted in HPI   Additional history obtained:  Additional history obtained from EMR and daughter-in-law at bedside External records from outside source obtained and reviewed including family medicine notes documenting chronic problems from June of this year   Lab Tests:  I Ordered, and personally interpreted labs.  The pertinent results include: Creatinine 2.97 (baseline appears to be closer to 1.3, with the highest level recorded over the past 3 years being 1.37), mild leukocytosis with a white count of 13,700; unremarkable BMP, troponin, respiratory panel   Imaging Studies ordered:  I ordered imaging studies including plain films of bilateral tibia/fibula, chest x-ray I independently visualized and interpreted imaging which showed  1. Diffuse subcutaneous edema throughout the bilateral lower  extremities compatible with given history of cellulitis. No  subcutaneous gas or radiopaque foreign body.  2. No acute or destructive bony abnormalities.  3. Osteoarthritis of the bilateral knees.  Hiatal hernia on chest x-ray I agree with the radiologist interpretation   Cardiac Monitoring: / EKG:  The patient was maintained on a cardiac monitor.  I personally viewed and interpreted the cardiac monitored which showed an underlying rhythm of: Sinus rhythm   Problem List / ED Course / Critical interventions / Medication management   I ordered medication including saline bolus Reevaluation of the patient after these medicines showed that the patient stayed the same   Consultations Obtained:  I requested consultation with the medicine team,  and discussed lab and imaging findings as well as pertinent  plan - they recommend: ***   Social Determinants of Health:  Patient has Medicaid for her primary health insurance type   Test / Admission - Considered:  ***   {Document critical care time when appropriate  Document review of labs and clinical decision tools ie CHADS2VASC2, etc  Document your independent review of radiology images and any outside records  Document your discussion with family members, caretakers and with consultants  Document social determinants of health affecting pt's care  Document your decision making why or why  not admission, treatments were needed:32947:::1}   Final diagnoses:  None    ED Discharge Orders     None        "

## 2024-02-29 NOTE — ED Triage Notes (Signed)
 Patient reports significant leg swelling with weeping edema bilaterally, red areas noted to lower calves on both sides as well. Patient also reports shob, unable to sleep because she is short of breath when laying down. Also reports chills, tremors, and general body aches. Hx of lymphedema, but no CHF.

## 2024-02-29 NOTE — ED Provider Notes (Signed)
 "  EMERGENCY DEPARTMENT AT [facility] Provider Note   CSN: [id number] Arrival date & time: 02/29/24  1827     Patient presents with: Shortness of Breath, Leg Swelling, and Orthopnea   Doris Miller is a 64 y.o. female.  Patient with past medical history significant for morbid obesity, GERD, cellulitis of the left lower leg, lymphedema presents to the emergency department complaining of bilateral lower leg swelling with weeping edema.  Patient also reports shortness of breath when lying flat, generalized body aches/chills for the past 2 weeks.  She denies specific chest pain/shortness of breath except for the orthopnea.  Patient denies abdominal pain, nausea, vomiting, diarrhea.  She does endorse decreased fluid intake over the past few weeks.    Shortness of Breath      Prior to Admission medications  Medication Sig Start Date End Date Taking? Authorizing Provider  albuterol  (VENTOLIN  HFA) 108 (90 Base) MCG/ACT inhaler Inhale 1-2 puffs into the lungs every 6 (six) hours as needed for wheezing or shortness of breath. 03/08/19   Moishe Chiquita HERO, NP  benzonatate  (TESSALON ) 200 MG capsule Take 1 capsule (200 mg total) by mouth 3 (three) times daily as needed for up to 30 doses for cough. 07/05/22   Melvenia Manus BRAVO, MD  Cholecalciferol (VITAMIN D3) 25 MCG (1000 UT) CAPS Take 1 capsule (1,000 Units total) by mouth daily. 03/26/21   Paseda, Folashade R, FNP  escitalopram  (LEXAPRO ) 20 MG tablet Take 1 tablet by mouth once daily 01/22/24   Del Orbe Polanco, Iliana, FNP  lamoTRIgine  (LAMICTAL ) 25 MG tablet Take 1 tablet by mouth once daily 01/31/24   Del Orbe Polanco, Iliana, FNP  lisinopril -hydrochlorothiazide  (ZESTORETIC ) 10-12.5 MG tablet Take 1 tablet by mouth once daily 11/22/23   Del Orbe Polanco, Iliana, FNP  meloxicam  (MOBIC ) 7.5 MG tablet TAKE 1 TABLET BY MOUTH ONCE DAILY AS NEEDED FOR PAIN 11/28/23   Del Orbe Polanco, Iliana, FNP  methocarbamol  (ROBAXIN ) 500 MG tablet  Take 1 tablet (500 mg total) by mouth 2 (two) times daily. 09/03/22   Schutt, Marsa HERO, PA-C  omeprazole  (PRILOSEC) 20 MG capsule Take 1 capsule by mouth once daily 01/02/24   Del Orbe Polanco, Iliana, FNP    Allergies: Codeine and Diclofenac    Review of Systems  Respiratory:  Positive for shortness of breath.     Updated Vital Signs BP (!) 130/117 (BP Location: Left Arm)   Pulse (!) 102   Temp (!) 97.3 F (36.3 C) (Oral)   Resp 15   Ht 5' 5 (1.651 m)   Wt (!) 150.1 kg   LMP 06/13/2011   SpO2 100%   BMI 55.08 kg/m   Physical Exam Vitals and nursing note reviewed.  Constitutional:      General: She is not in acute distress.    Appearance: She is well-developed. She is obese.  HENT:     Head: Normocephalic and atraumatic.  Eyes:     Conjunctiva/sclera: Conjunctivae normal.  Cardiovascular:     Rate and Rhythm: Normal rate and regular rhythm.     Heart sounds: No murmur heard. Pulmonary:     Effort: Pulmonary effort is normal. No respiratory distress.     Breath sounds: Normal breath sounds.  Abdominal:     Palpations: Abdomen is soft.     Tenderness: There is no abdominal tenderness.  Musculoskeletal:        General: No swelling.     Cervical back: Neck supple.  Right lower leg: Edema present.     Left lower leg: Edema present.     Comments: Bilateral lower extremities with weeping edema, some bilateral erythema  Skin:    General: Skin is warm and dry.     Capillary Refill: Capillary refill takes less than 2 seconds.  Neurological:     Mental Status: She is alert.  Psychiatric:        Mood and Affect: Mood normal.     (all labs ordered are listed, but only abnormal results are displayed) Labs Reviewed  COMPREHENSIVE METABOLIC PANEL WITH GFR - Abnormal; Notable for the following components:      Result Value   CO2 15 (*)    BUN 85 (*)    Creatinine, Ser 2.97 (*)    Albumin 3.2 (*)    AST 14 (*)    Alkaline Phosphatase 195 (*)    GFR, Estimated 17  (*)    Anion gap 16 (*)    All other components within normal limits  CBC WITH DIFFERENTIAL/PLATELET - Abnormal; Notable for the following components:   WBC 13.7 (*)    RBC 3.74 (*)    Hemoglobin 9.4 (*)    HCT 32.0 (*)    MCH 25.1 (*)    MCHC 29.4 (*)    RDW 19.1 (*)    Platelets 419 (*)    Neutro Abs 10.7 (*)    Abs Immature Granulocytes 0.20 (*)    All other components within normal limits  RESP PANEL BY RT-PCR (RSV, FLU A&B, COVID)  RVPGX2  PRO BRAIN NATRIURETIC PEPTIDE  I-STAT CG4 LACTIC ACID, ED  TROPONIN T, HIGH SENSITIVITY  TROPONIN T, HIGH SENSITIVITY    EKG: None  Radiology: DG Tibia/Fibula Left Result Date: 02/29/2024 CLINICAL DATA:  Bilateral lower extremity edema, cellulitis EXAM: LEFT TIBIA AND FIBULA - 2 VIEW; RIGHT TIBIA AND FIBULA - 2 VIEW COMPARISON:  12/09/2003 FINDINGS: Left tibia/fibula: Frontal and lateral views are obtained. There is severe diffuse subcutaneous edema. No subcutaneous gas or radiopaque foreign body. There are no acute or destructive bony abnormalities. Moderate 3 compartmental left knee osteoarthritis. Right tibia/fibula: Frontal and lateral views are obtained. There is severe diffuse subcutaneous edema. No subcutaneous gas or radiopaque foreign body. There are no acute or destructive bony abnormalities. Moderate 3 compartmental right knee osteoarthritis. IMPRESSION: 1. Diffuse subcutaneous edema throughout the bilateral lower extremities compatible with given history of cellulitis. No subcutaneous gas or radiopaque foreign body. 2. No acute or destructive bony abnormalities. 3. Osteoarthritis of the bilateral knees. Electronically Signed   By: Ozell Daring M.D.   On: 02/29/2024 20:07   DG Tibia/Fibula Right Result Date: 02/29/2024 CLINICAL DATA:  Bilateral lower extremity edema, cellulitis EXAM: LEFT TIBIA AND FIBULA - 2 VIEW; RIGHT TIBIA AND FIBULA - 2 VIEW COMPARISON:  12/09/2003 FINDINGS: Left tibia/fibula: Frontal and lateral views are  obtained. There is severe diffuse subcutaneous edema. No subcutaneous gas or radiopaque foreign body. There are no acute or destructive bony abnormalities. Moderate 3 compartmental left knee osteoarthritis. Right tibia/fibula: Frontal and lateral views are obtained. There is severe diffuse subcutaneous edema. No subcutaneous gas or radiopaque foreign body. There are no acute or destructive bony abnormalities. Moderate 3 compartmental right knee osteoarthritis. IMPRESSION: 1. Diffuse subcutaneous edema throughout the bilateral lower extremities compatible with given history of cellulitis. No subcutaneous gas or radiopaque foreign body. 2. No acute or destructive bony abnormalities. 3. Osteoarthritis of the bilateral knees. Electronically Signed   By: Ozell Daring HERO.D.  On: 02/29/2024 20:07   DG Chest 2 View Result Date: 02/29/2024 CLINICAL DATA:  Shortness of breath, cellulitis, lower extremity edema EXAM: CHEST - 2 VIEW COMPARISON:  06/15/2011 FINDINGS: Frontal and lateral views of the chest demonstrate an unremarkable cardiac silhouette. Large hiatal hernia. No airspace disease, effusion, or pneumothorax. No acute bony abnormalities. IMPRESSION: 1. No acute intrathoracic process. 2. Large hiatal hernia. Electronically Signed   By: Ozell Daring M.D.   On: 02/29/2024 20:06     Procedures   Medications Ordered in the ED  sodium chloride  0.9 % bolus 1,000 mL (1,000 mLs Intravenous New Bag/Given 03/01/24 0031)                                    Medical Decision Making  This patient presents to the ED for concern of bilateral leg swelling, this involves an extensive number of treatment options, and is a complaint that carries with it a high risk of complications and morbidity.  The differential diagnosis includes venous stasis, lymphedema, cellulitis, DVT, others.  Patient also complains of shortness of breath with lying down.  Differential includes right-sided heart failure, OSA, others   Co  morbidities / Chronic conditions that complicate the patient evaluation  As noted in HPI   Additional history obtained:  Additional history obtained from EMR and daughter-in-law at bedside External records from outside source obtained and reviewed including family medicine notes documenting chronic problems from June of this year   Lab Tests:  I Ordered, and personally interpreted labs.  The pertinent results include: Creatinine 2.97 (baseline appears to be closer to 1.3, with the highest level recorded over the past 3 years being 1.37), mild leukocytosis with a white count of 13,700; unremarkable BMP, troponin, respiratory panel   Imaging Studies ordered:  I ordered imaging studies including plain films of bilateral tibia/fibula, chest x-ray I independently visualized and interpreted imaging which showed  1. Diffuse subcutaneous edema throughout the bilateral lower  extremities compatible with given history of cellulitis. No  subcutaneous gas or radiopaque foreign body.  2. No acute or destructive bony abnormalities.  3. Osteoarthritis of the bilateral knees.  Hiatal hernia on chest x-ray I agree with the radiologist interpretation   Cardiac Monitoring: / EKG:  The patient was maintained on a cardiac monitor.  I personally viewed and interpreted the cardiac monitored which showed an underlying rhythm of: Sinus rhythm   Problem List / ED Course / Critical interventions / Medication management   I ordered medication including saline bolus Reevaluation of the patient after these medicines showed that the patient stayed the same   Consultations Obtained:  I requested consultation with the medicine team, Dr.Sundil, and discussed lab and imaging findings as well as pertinent plan - they recommend: admission   Social Determinants of Health:  Patient has Medicaid for her primary health insurance type   Test / Admission - Considered:  Patient with lab work concerning for  AKI with significantly elevated creatinine.  Patient also has elevated BUN.  Patient states that she has a decreased oral intake over the past few weeks due to feeling unwell.  Fluids have been initiated.  Patient will need admission for further management.  Patient was concerned about bilateral leg swelling.  Legs appear more consistent with lymphedema/venous stasis than cellulitis to me at this time.  Lactic acid level was normal.  No fever noted.  No systemic signs of infection  other than mild leukocytosis.      Final diagnoses:  AKI (acute kidney injury)  Leg edema    ED Discharge Orders     None          Logan Ubaldo KATHEE DEVONNA 03/01/24 0126  "

## 2024-02-29 NOTE — ED Provider Triage Note (Signed)
 Emergency Medicine Provider Triage Evaluation Note  Doris Miller , a 64 y.o. female  was evaluated in triage.  Pt complains of flulike symptoms for the past 2 weeks.  Also notes increasing leg swelling and pain.  Associated redness to the legs.  Denies history of CHF, feels like previous cellulitis.  Endorses some chest pain, shortness of breath, chills.  Review of Systems  Positive: Chest pain, shortness of breath, chills, body aches Negative: Cough, fevers, abdominal pain, nausea/vomiting  Physical Exam  BP 102/63 (BP Location: Right Arm)   Pulse 98   Temp 99.3 F (37.4 C) (Axillary)   Resp 18   Ht 5' 5 (1.651 m)   Wt (!) 150.1 kg   LMP 06/13/2011   SpO2 100%   BMI 55.08 kg/m  Gen:   Awake, no distress   Resp:  Normal effort  MSK:   Moves extremities without difficulty  Other:  Erythema and edema noted to bilateral lower extremities.  Medical Decision Making  Medically screening exam initiated at 7:13 PM.  Appropriate orders placed.  Barnie VEAR Cleverly was informed that the remainder of the evaluation will be completed by another provider, this initial triage assessment does not replace that evaluation, and the importance of remaining in the ED until their evaluation is complete.     Neysa Thersia RAMAN, [place] 02/29/24 [phone]

## 2024-03-01 ENCOUNTER — Encounter (HOSPITAL_COMMUNITY): Payer: Self-pay | Admitting: Internal Medicine

## 2024-03-01 DIAGNOSIS — I10 Essential (primary) hypertension: Secondary | ICD-10-CM

## 2024-03-01 DIAGNOSIS — F32A Depression, unspecified: Secondary | ICD-10-CM | POA: Diagnosis present

## 2024-03-01 DIAGNOSIS — N179 Acute kidney failure, unspecified: Principal | ICD-10-CM

## 2024-03-01 DIAGNOSIS — Z6841 Body Mass Index (BMI) 40.0 and over, adult: Secondary | ICD-10-CM

## 2024-03-01 DIAGNOSIS — K219 Gastro-esophageal reflux disease without esophagitis: Secondary | ICD-10-CM

## 2024-03-01 DIAGNOSIS — Z888 Allergy status to other drugs, medicaments and biological substances status: Secondary | ICD-10-CM | POA: Diagnosis not present

## 2024-03-01 DIAGNOSIS — E872 Acidosis, unspecified: Secondary | ICD-10-CM

## 2024-03-01 DIAGNOSIS — Z1152 Encounter for screening for COVID-19: Secondary | ICD-10-CM | POA: Diagnosis not present

## 2024-03-01 DIAGNOSIS — L03115 Cellulitis of right lower limb: Secondary | ICD-10-CM | POA: Diagnosis present

## 2024-03-01 DIAGNOSIS — K449 Diaphragmatic hernia without obstruction or gangrene: Secondary | ICD-10-CM | POA: Diagnosis present

## 2024-03-01 DIAGNOSIS — Z8249 Family history of ischemic heart disease and other diseases of the circulatory system: Secondary | ICD-10-CM | POA: Diagnosis not present

## 2024-03-01 DIAGNOSIS — D649 Anemia, unspecified: Secondary | ICD-10-CM | POA: Diagnosis present

## 2024-03-01 DIAGNOSIS — Z825 Family history of asthma and other chronic lower respiratory diseases: Secondary | ICD-10-CM | POA: Diagnosis not present

## 2024-03-01 DIAGNOSIS — L03116 Cellulitis of left lower limb: Secondary | ICD-10-CM | POA: Diagnosis present

## 2024-03-01 DIAGNOSIS — L03119 Cellulitis of unspecified part of limb: Secondary | ICD-10-CM

## 2024-03-01 DIAGNOSIS — M170 Bilateral primary osteoarthritis of knee: Secondary | ICD-10-CM | POA: Diagnosis present

## 2024-03-01 DIAGNOSIS — Z8049 Family history of malignant neoplasm of other genital organs: Secondary | ICD-10-CM | POA: Diagnosis not present

## 2024-03-01 DIAGNOSIS — F419 Anxiety disorder, unspecified: Secondary | ICD-10-CM

## 2024-03-01 DIAGNOSIS — Z820 Family history of epilepsy and other diseases of the nervous system: Secondary | ICD-10-CM | POA: Diagnosis not present

## 2024-03-01 DIAGNOSIS — Z803 Family history of malignant neoplasm of breast: Secondary | ICD-10-CM | POA: Diagnosis not present

## 2024-03-01 DIAGNOSIS — I890 Lymphedema, not elsewhere classified: Secondary | ICD-10-CM

## 2024-03-01 DIAGNOSIS — M7989 Other specified soft tissue disorders: Secondary | ICD-10-CM | POA: Diagnosis present

## 2024-03-01 LAB — CBC
HCT: 28 % — ABNORMAL LOW (ref 36.0–46.0)
Hemoglobin: 8.4 g/dL — ABNORMAL LOW (ref 12.0–15.0)
MCH: 25.1 pg — ABNORMAL LOW (ref 26.0–34.0)
MCHC: 30 g/dL (ref 30.0–36.0)
MCV: 83.6 fL (ref 80.0–100.0)
Platelets: 389 K/uL (ref 150–400)
RBC: 3.35 MIL/uL — ABNORMAL LOW (ref 3.87–5.11)
RDW: 19.1 % — ABNORMAL HIGH (ref 11.5–15.5)
WBC: 12.6 K/uL — ABNORMAL HIGH (ref 4.0–10.5)
nRBC: 0 % (ref 0.0–0.2)

## 2024-03-01 LAB — URINALYSIS, ROUTINE W REFLEX MICROSCOPIC
Bilirubin Urine: NEGATIVE
Glucose, UA: NEGATIVE mg/dL
Ketones, ur: NEGATIVE mg/dL
Nitrite: NEGATIVE
Protein, ur: NEGATIVE mg/dL
Specific Gravity, Urine: 1.015 (ref 1.005–1.030)
WBC, UA: >50 WBC/hpf (ref 0–5)
pH: 5 (ref 5.0–8.0)

## 2024-03-01 LAB — BASIC METABOLIC PANEL WITH GFR
Anion gap: 12 (ref 5–15)
BUN: 79 mg/dL — ABNORMAL HIGH (ref 8–23)
CO2: 15 mmol/L — ABNORMAL LOW (ref 22–32)
Calcium: 9.3 mg/dL (ref 8.9–10.3)
Chloride: 110 mmol/L (ref 98–111)
Creatinine, Ser: 2.83 mg/dL — ABNORMAL HIGH (ref 0.44–1.00)
GFR, Estimated: 18 mL/min — ABNORMAL LOW
Glucose, Bld: 103 mg/dL — ABNORMAL HIGH (ref 70–99)
Potassium: 3.6 mmol/L (ref 3.5–5.1)
Sodium: 137 mmol/L (ref 135–145)

## 2024-03-01 LAB — CREATININE, URINE, RANDOM: Creatinine, Urine: 151 mg/dL

## 2024-03-01 LAB — RETICULOCYTES
Immature Retic Fract: 13.1 % (ref 2.3–15.9)
RBC.: 3.28 MIL/uL — ABNORMAL LOW (ref 3.87–5.11)
Retic Count, Absolute: 44.9 K/uL (ref 19.0–186.0)
Retic Ct Pct: 1.4 % (ref 0.4–3.1)

## 2024-03-01 LAB — IRON AND TIBC
Iron: 24 ug/dL — ABNORMAL LOW (ref 28–170)
Saturation Ratios: 9 % — ABNORMAL LOW (ref 10.4–31.8)
TIBC: 273 ug/dL (ref 250–450)
UIBC: 249 ug/dL

## 2024-03-01 LAB — SODIUM, URINE, RANDOM: Sodium, Ur: 43 mmol/L

## 2024-03-01 LAB — FOLATE: Folate: 5.9 ng/mL — ABNORMAL LOW

## 2024-03-01 LAB — VITAMIN B12: Vitamin B-12: 281 pg/mL (ref 180–914)

## 2024-03-01 LAB — FERRITIN: Ferritin: 112 ng/mL (ref 11–307)

## 2024-03-01 LAB — HIV ANTIBODY (ROUTINE TESTING W REFLEX): HIV Screen 4th Generation wRfx: NONREACTIVE

## 2024-03-01 MED ORDER — VANCOMYCIN VARIABLE DOSE PER UNSTABLE RENAL FUNCTION (PHARMACIST DOSING): Status: DC

## 2024-03-01 MED ORDER — SODIUM BICARBONATE 650 MG PO TABS
650.0000 mg | ORAL_TABLET | Freq: Three times a day (TID) | ORAL | Status: AC
  Administered 2024-03-01 (×3): 650 mg via ORAL
  Filled 2024-03-01 (×3): qty 1

## 2024-03-01 MED ORDER — VANCOMYCIN HCL 10 G IV SOLR
2250.0000 mg | Freq: Once | INTRAVENOUS | Status: AC
  Administered 2024-03-01: 2250 mg via INTRAVENOUS
  Filled 2024-03-01 (×2): qty 22.5

## 2024-03-01 MED ORDER — SODIUM CHLORIDE 0.9 % IV SOLN: 250.0000 mL | INTRAVENOUS | Status: AC | PRN

## 2024-03-01 MED ORDER — LAMOTRIGINE 25 MG PO TABS
25.0000 mg | ORAL_TABLET | Freq: Every day | ORAL | Status: DC
  Administered 2024-03-01 – 2024-03-04 (×4): 25 mg via ORAL
  Filled 2024-03-01 (×4): qty 1

## 2024-03-01 MED ORDER — PANTOPRAZOLE SODIUM 40 MG PO TBEC
40.0000 mg | DELAYED_RELEASE_TABLET | Freq: Every day | ORAL | Status: DC
  Administered 2024-03-01 – 2024-03-04 (×4): 40 mg via ORAL
  Filled 2024-03-01 (×4): qty 1

## 2024-03-01 MED ORDER — OXYCODONE HCL 5 MG PO TABS
5.0000 mg | ORAL_TABLET | Freq: Four times a day (QID) | ORAL | Status: AC | PRN
  Administered 2024-03-02 (×3): 5 mg via ORAL
  Filled 2024-03-01 (×3): qty 1

## 2024-03-01 MED ORDER — SODIUM CHLORIDE 0.9% FLUSH: 3.0000 mL | INTRAVENOUS | Status: DC | PRN

## 2024-03-01 MED ORDER — ALBUTEROL SULFATE (2.5 MG/3ML) 0.083% IN NEBU: 3.0000 mL | INHALATION_SOLUTION | Freq: Four times a day (QID) | RESPIRATORY_TRACT | Status: DC | PRN

## 2024-03-01 MED ORDER — HEPARIN SODIUM (PORCINE) 5000 UNIT/ML IJ SOLN
5000.0000 [IU] | Freq: Three times a day (TID) | INTRAMUSCULAR | Status: DC
  Administered 2024-03-01 – 2024-03-04 (×10): 5000 [IU] via SUBCUTANEOUS
  Filled 2024-03-01 (×10): qty 1

## 2024-03-01 MED ORDER — ACETAMINOPHEN 325 MG PO TABS
650.0000 mg | ORAL_TABLET | Freq: Four times a day (QID) | ORAL | Status: DC | PRN
  Administered 2024-03-01: 650 mg via ORAL
  Filled 2024-03-01: qty 2

## 2024-03-01 MED ORDER — ESCITALOPRAM OXALATE 10 MG PO TABS
20.0000 mg | ORAL_TABLET | Freq: Every day | ORAL | Status: DC
  Administered 2024-03-01 – 2024-03-04 (×4): 20 mg via ORAL
  Filled 2024-03-01 (×4): qty 2

## 2024-03-01 MED ORDER — ONDANSETRON HCL 4 MG PO TABS
4.0000 mg | ORAL_TABLET | Freq: Four times a day (QID) | ORAL | Status: DC | PRN
  Administered 2024-03-03: 4 mg via ORAL
  Filled 2024-03-01: qty 1

## 2024-03-01 MED ORDER — SODIUM CHLORIDE 0.9 % IV SOLN
2.0000 g | INTRAVENOUS | Status: DC
  Administered 2024-03-01: 2 g via INTRAVENOUS
  Filled 2024-03-01: qty 12.5

## 2024-03-01 MED ORDER — VANCOMYCIN HCL 750 MG/150ML IV SOLN
750.0000 mg | INTRAVENOUS | Status: DC
  Administered 2024-03-02 – 2024-03-03 (×2): 750 mg via INTRAVENOUS
  Filled 2024-03-01 (×2): qty 150

## 2024-03-01 MED ORDER — SODIUM CHLORIDE 0.9% FLUSH
3.0000 mL | Freq: Two times a day (BID) | INTRAVENOUS | Status: DC
  Administered 2024-03-01 – 2024-03-04 (×3): 3 mL via INTRAVENOUS

## 2024-03-01 MED ORDER — LACTATED RINGERS IV SOLN: INTRAVENOUS | Status: AC

## 2024-03-01 MED ORDER — SODIUM CHLORIDE 0.9 % IV SOLN
2.0000 g | Freq: Two times a day (BID) | INTRAVENOUS | Status: DC
  Administered 2024-03-01 – 2024-03-04 (×6): 2 g via INTRAVENOUS
  Filled 2024-03-01 (×7): qty 12.5

## 2024-03-01 MED ORDER — ONDANSETRON HCL 4 MG/2ML IJ SOLN: 4.0000 mg | Freq: Four times a day (QID) | INTRAMUSCULAR | Status: DC | PRN

## 2024-03-01 MED ORDER — SODIUM CHLORIDE 0.9% FLUSH
3.0000 mL | Freq: Two times a day (BID) | INTRAVENOUS | Status: DC
  Administered 2024-03-01 – 2024-03-04 (×3): 3 mL via INTRAVENOUS

## 2024-03-01 MED ORDER — ACETAMINOPHEN 650 MG RE SUPP: 650.0000 mg | Freq: Four times a day (QID) | RECTAL | Status: DC | PRN

## 2024-03-01 NOTE — H&P (Addendum)
 " History and Physical    Doris Miller FMW:[id number] DOB: 1960-03-08 DOA: 02/29/2024  PCP: Terry Wilhelmena Lloyd Hilario, FNP   Patient coming from: Home   Chief Complaint:  Chief Complaint  Patient presents with   Shortness of Breath   Leg Swelling   Orthopnea   ED TRIAGE note:Patient reports significant leg swelling with weeping edema bilaterally, red areas noted to lower calves on both sides as well. Patient also reports shob, unable to sleep because she is short of breath when laying down. Also reports chills, tremors, and general body aches. Hx of lymphedema, but no CHF.   HPI:  Doris Miller is a 64 y.o. female with medical history significant of chronic lymphedema of the bilateral lower extremity, depression, anxiety, GERD, morbid obesity, and essential hypertension presented to emergency department complaining of bilateral lower extremity swelling with weeping edema, associated shortness of breath-dyspnea.  Patient denies orthopnea and nocturnal dyspnea.  Patient also complaining of generalized body ache chills for last 2 days.  Denies any chest pain and palpitation.  Patient reported decreased fluid intake for past few weeks.  Denies any abdominal pain nausea vomiting diarrhea. Patient reports history of recurrent cellulitis of bilateral lower extremities.  Patient reported that over the course of last 2 to 3 days she has been noticed increasing redness, pain and swelling of the bilateral lower extremities with associated chill.  Denies any fever at home. No other complaint at this time.  ED Course:  At presentation to emergency department patient is hemodynamically stable.  Afebrile.  O2 sat 100% room air. Lab work, CBC showing leukocytosis 13.7, low H&H 9.4 and 32 and elevated platelet count 419.  Baseline hemoglobin around 13-14 range. CMP showing low bicarb 15, elevated creatinine 2.97, elevated ALP 195, low GFR 17 and elevated ion gap 16 and evaded BUN 85. Normal proBNP  119. Troponin x 2 within normal range. Respiratory panel negative.  Chest x-ray no acute intrathoracic process.  Large hiatal hernia. X-ray tibia-fibula of bilateral lower extremities:  Diffuse subcutaneous edema throughout the bilateral lower extremities compatible with given history of cellulitis. No subcutaneous gas or radiopaque foreign body. 2. No acute or destructive bony abnormalities. 3. Osteoarthritis of the bilateral knees.  Due to AKI in the ED patient received 1 L of NS bolus.    Hospitalist consulted for further evaluation management of bilateral lower extremity cellulitis, anemia, AKI, anion gap metabolic acidosis and elevated BUN.    Significant labs in the ED: Lab Orders         Resp panel by RT-PCR (RSV, Flu A&B, Covid) Anterior Nasal Swab         Culture, blood (Routine X 2) w Reflex to ID Panel         Comprehensive metabolic panel         CBC with Differential         Pro Brain natriuretic peptide         Urinalysis, Routine w reflex microscopic -Urine, Clean Catch         Creatinine, urine, random         Sodium, urine, random         HIV Antibody (routine testing w rflx)         CBC         Basic metabolic panel         Vitamin B12         Folate         Iron  and TIBC         Ferritin         Reticulocytes       Review of Systems:  Review of Systems  Constitutional:  Positive for chills and malaise/fatigue. Negative for diaphoresis, fever and weight loss.  Respiratory:  Positive for shortness of breath. Negative for cough, sputum production and wheezing.   Cardiovascular:  Positive for leg swelling. Negative for chest pain, orthopnea and claudication.  Gastrointestinal:  Negative for abdominal pain, diarrhea, heartburn, nausea and vomiting.  Genitourinary:  Negative for dysuria, frequency and urgency.  Musculoskeletal:  Negative for back pain, falls, joint pain, myalgias and neck pain.  Neurological:  Negative for dizziness and headaches.   Psychiatric/Behavioral:  The patient is not nervous/anxious.     Past Medical History:  Diagnosis Date   Arthritis    Bronchospasm 03/12/2019   Depression    Encounter for hepatitis C screening test for low risk patient 03/12/2019   Encounter for screening for HIV 03/12/2019   Encounter for screening for malignant neoplasm of colon 03/12/2019   Encounter for screening mammogram for malignant neoplasm of breast 03/12/2019   GERD (gastroesophageal reflux disease)    Hypertension    Low back pain radiating to right leg 10/25/2021   Lymphedema    OA (osteoarthritis) of knee right     Past Surgical History:  Procedure Laterality Date   CHOLECYSTECTOMY     cyst removal left wrist Left    TUBAL LIGATION       reports that she has never smoked. She has been exposed to tobacco smoke. She has never used smokeless tobacco. She reports that she does not drink alcohol and does not use drugs.  Allergies[1]  Family History  Problem Relation Age of Onset   CAD Mother    Breast cancer Mother    Endometrial cancer Mother    Seizures Mother    CAD Father    Emphysema Father    COPD Father    Seizures Brother    Epilepsy Son     Prior to Admission medications  Medication Sig Start Date End Date Taking? Authorizing Provider  albuterol  (VENTOLIN  HFA) 108 (90 Base) MCG/ACT inhaler Inhale 1-2 puffs into the lungs every 6 (six) hours as needed for wheezing or shortness of breath. 03/08/19   Moishe Chiquita HERO, NP  benzonatate  (TESSALON ) 200 MG capsule Take 1 capsule (200 mg total) by mouth 3 (three) times daily as needed for up to 30 doses for cough. 07/05/22   Melvenia Manus BRAVO, MD  Cholecalciferol (VITAMIN D3) 25 MCG (1000 UT) CAPS Take 1 capsule (1,000 Units total) by mouth daily. 03/26/21   Paseda, Folashade R, FNP  escitalopram  (LEXAPRO ) 20 MG tablet Take 1 tablet by mouth once daily 01/22/24   Del Orbe Polanco, Iliana, FNP  lamoTRIgine  (LAMICTAL ) 25 MG tablet Take 1 tablet by mouth once daily  01/31/24   Del Orbe Polanco, Iliana, FNP  lisinopril -hydrochlorothiazide  (ZESTORETIC ) 10-12.5 MG tablet Take 1 tablet by mouth once daily 11/22/23   Del Orbe Polanco, Iliana, FNP  meloxicam  (MOBIC ) 7.5 MG tablet TAKE 1 TABLET BY MOUTH ONCE DAILY AS NEEDED FOR PAIN 11/28/23   Del Orbe Polanco, Iliana, FNP  methocarbamol  (ROBAXIN ) 500 MG tablet Take 1 tablet (500 mg total) by mouth 2 (two) times daily. 09/03/22   Schutt, Marsa HERO, PA-C  omeprazole  (PRILOSEC) 20 MG capsule Take 1 capsule by mouth once daily 01/02/24   Del Orbe Polanco, Iliana, FNP  Physical Exam: Vitals:   02/29/24 1842 02/29/24 1856 02/29/24 1859 02/29/24 2340  BP: 102/63   (!) 130/117  Pulse: 98   (!) 102  Resp: 18   15  Temp: 98.1 F (36.7 C)  99.3 F (37.4 C) (!) 97.3 F (36.3 C)  TempSrc: Oral  Axillary Oral  SpO2: 100%   100%  Weight:  (!) 150.1 kg    Height:  5' 5 (1.651 m)      Physical Exam Constitutional:      Appearance: She is ill-appearing.  Cardiovascular:     Rate and Rhythm: Normal rate and regular rhythm.  Pulmonary:     Effort: Pulmonary effort is normal.     Breath sounds: Normal breath sounds.  Abdominal:     Palpations: Abdomen is soft.  Musculoskeletal:     Right lower leg: Tenderness present. Edema present.     Left lower leg: Tenderness present. Edema present.  Skin:    General: Skin is warm.     Capillary Refill: Capillary refill takes less than 2 seconds.     Findings: Erythema present.  Neurological:     Mental Status: She is alert and oriented to person, place, and time.  Psychiatric:        Mood and Affect: Mood normal.      Labs on Admission: I have personally reviewed following labs and imaging studies  CBC: Recent Labs  Lab 02/29/24 1906  WBC 13.7*  NEUTROABS 10.7*  HGB 9.4*  HCT 32.0*  MCV 85.6  PLT 419*   Basic Metabolic Panel: Recent Labs  Lab 02/29/24 1906  NA 139  K 4.1  CL 109  CO2 15*  GLUCOSE 93  BUN 85*  CREATININE 2.97*  CALCIUM 9.8    GFR: Estimated Creatinine Clearance: 29.6 mL/min (A) (by C-G formula based on SCr of 2.97 mg/dL (H)). Liver Function Tests: Recent Labs  Lab 02/29/24 1906  AST 14*  ALT 11  ALKPHOS 195*  BILITOT 0.2  PROT 6.7  ALBUMIN 3.2*   No results for input(s): LIPASE, AMYLASE in the last 168 hours. No results for input(s): AMMONIA in the last 168 hours. Coagulation Profile: No results for input(s): INR, PROTIME in the last 168 hours. Cardiac Enzymes: No results for input(s): CKTOTAL, CKMB, CKMBINDEX, TROPONINI, TROPONINIHS in the last 168 hours. BNP (last 3 results) No results for input(s): BNP in the last 8760 hours. HbA1C: No results for input(s): HGBA1C in the last 72 hours. CBG: No results for input(s): GLUCAP in the last 168 hours. Lipid Profile: No results for input(s): CHOL, HDL, LDLCALC, TRIG, CHOLHDL, LDLDIRECT in the last 72 hours. Thyroid  Function Tests: No results for input(s): TSH, T4TOTAL, FREET4, T3FREE, THYROIDAB in the last 72 hours. Anemia Panel: No results for input(s): VITAMINB12, FOLATE, FERRITIN, TIBC, IRON, RETICCTPCT in the last 72 hours. Urine analysis:    Component Value Date/Time   COLORURINE YELLOW 06/15/2011 2011   APPEARANCEUR CLEAR 06/15/2011 2011   LABSPEC 1.010 06/15/2011 2011   PHURINE 6.0 06/15/2011 2011   GLUCOSEU NEGATIVE 06/15/2011 2011   HGBUR NEGATIVE 06/15/2011 2011   BILIRUBINUR NEGATIVE 06/15/2011 2011   KETONESUR NEGATIVE 06/15/2011 2011   PROTEINUR NEGATIVE 06/15/2011 2011   UROBILINOGEN 0.2 06/15/2011 2011   NITRITE NEGATIVE 06/15/2011 2011   LEUKOCYTESUR NEGATIVE 06/15/2011 2011    Radiological Exams on Admission: I have personally reviewed images DG Tibia/Fibula Left Result Date: 02/29/2024 CLINICAL DATA:  Bilateral lower extremity edema, cellulitis EXAM: LEFT TIBIA AND FIBULA - 2 VIEW;  RIGHT TIBIA AND FIBULA - 2 VIEW COMPARISON:  12/09/2003 FINDINGS: Left  tibia/fibula: Frontal and lateral views are obtained. There is severe diffuse subcutaneous edema. No subcutaneous gas or radiopaque foreign body. There are no acute or destructive bony abnormalities. Moderate 3 compartmental left knee osteoarthritis. Right tibia/fibula: Frontal and lateral views are obtained. There is severe diffuse subcutaneous edema. No subcutaneous gas or radiopaque foreign body. There are no acute or destructive bony abnormalities. Moderate 3 compartmental right knee osteoarthritis. IMPRESSION: 1. Diffuse subcutaneous edema throughout the bilateral lower extremities compatible with given history of cellulitis. No subcutaneous gas or radiopaque foreign body. 2. No acute or destructive bony abnormalities. 3. Osteoarthritis of the bilateral knees. Electronically Signed   By: Ozell Daring M.D.   On: 02/29/2024 20:07   DG Tibia/Fibula Right Result Date: 02/29/2024 CLINICAL DATA:  Bilateral lower extremity edema, cellulitis EXAM: LEFT TIBIA AND FIBULA - 2 VIEW; RIGHT TIBIA AND FIBULA - 2 VIEW COMPARISON:  12/09/2003 FINDINGS: Left tibia/fibula: Frontal and lateral views are obtained. There is severe diffuse subcutaneous edema. No subcutaneous gas or radiopaque foreign body. There are no acute or destructive bony abnormalities. Moderate 3 compartmental left knee osteoarthritis. Right tibia/fibula: Frontal and lateral views are obtained. There is severe diffuse subcutaneous edema. No subcutaneous gas or radiopaque foreign body. There are no acute or destructive bony abnormalities. Moderate 3 compartmental right knee osteoarthritis. IMPRESSION: 1. Diffuse subcutaneous edema throughout the bilateral lower extremities compatible with given history of cellulitis. No subcutaneous gas or radiopaque foreign body. 2. No acute or destructive bony abnormalities. 3. Osteoarthritis of the bilateral knees. Electronically Signed   By: Ozell Daring M.D.   On: 02/29/2024 20:07   DG Chest 2 View Result Date:  02/29/2024 CLINICAL DATA:  Shortness of breath, cellulitis, lower extremity edema EXAM: CHEST - 2 VIEW COMPARISON:  06/15/2011 FINDINGS: Frontal and lateral views of the chest demonstrate an unremarkable cardiac silhouette. Large hiatal hernia. No airspace disease, effusion, or pneumothorax. No acute bony abnormalities. IMPRESSION: 1. No acute intrathoracic process. 2. Large hiatal hernia. Electronically Signed   By: Ozell Daring M.D.   On: 02/29/2024 20:06     Assessment/Plan: Principal Problem:   AKI (acute kidney injury) Active Problems:   Lower extremity cellulitis   Morbid obesity with body mass index (BMI) of 50.0 to 59.9 in adult [facility])   GERD (gastroesophageal reflux disease)   Essential hypertension   Metabolic acidosis   Normocytic anemia   Chronic acquired lymphedema   Anxiety and depression   Acute kidney injury    Assessment and Plan: Prerenal acute kidney injury Metabolic acidosis Elevated BUN -Patient presented emergency department complaining of worsening bilateral lower extremity swelling with associated redness, tenderness on palpation and chills at home.  Patient also stated poor appetite and poor oral intake of fluids. Presentation to ED patient is hemodynamically stable except blood pressure borderline soft. - CMP showed elevated creatinine 2.97 and elevated BUN 85.  Low bicarb 15 elevated anion gap 16. -Prerenal renal acute kidney injury in the setting of poor oral intake and active inflammatory/infectious process. - Check UA, urine creatinine and sodium.  Checking bladder scan. -In the ED patient received 1 L of NS bolus.  Continue maintenance fluid LR 100 cc/h. -Metabolic acidosis in the setting of AKI and elevated BUN in the setting of AKI due to poor renal clearance of BUN.  Patient is alert oriented x 3. - Starting sodium bicarb 650 mg 3 times daily for 1 day.  Need to follow-up  with bicarb/anion gap level.   -Continue to monitor improvement of renal  function, avoid nephrotoxic agents and renally adjust medications.   Bilateral lower extremity cellulitis SIRS -Patient has history of chronic lymphedema of the bilateral lower extremities.  Physical exam revealed erythema and tenderness of the bilateral lower extremities and warm to touch. - CBC showed leukocytosis and physical exam revealed source of infection patient meets SIRS criteria. - Obtaining blood cultures and initiating broad-spectrum antibiotic coverage with IV vancomycin  and cefepime .  Chronic lymphedema -Encourage limb elevation and ambulation with assistance as patient tolerates.   History of GERD -Continue Protonix   Essential hypertension -Holding lisinopril  and hydrochlorothiazide  in the setting of AKI and borderline low blood pressure.  Normocytic anemia -Baseline hemoglobin around 13-14.  Hemoglobin today 9.4 and hematocrit 32.  Normal MCV.  Checking anemia panel.  Patient denies any history of GI bleed.  Continue to monitor H&H  Anxiety and depression -Continue Lexapro   Morbid obesity high BMI 55 - Consulting inpatient PT and OT to assess for safe mobilization and assess for DME requirement with ambulation.   DVT prophylaxis:  SQ Heparin  Code Status:  Full Code Diet: Heart healthy diet Disposition Plan: Need to follow-up with blood cultures result and continue to monitor improvement of renal function. Consults: PT and OT admission status:   Inpatient, Telemetry bed  Severity of Illness: The appropriate patient status for this patient is INPATIENT. Inpatient status is judged to be reasonable and necessary in order to provide the required intensity of service to ensure the patient's safety. The patient's presenting symptoms, physical exam findings, and initial radiographic and laboratory data in the context of their chronic comorbidities is felt to place them at high risk for further clinical deterioration. Furthermore, it is not anticipated that the patient  will be medically stable for discharge from the hospital within 2 midnights of admission.   * I certify that at the point of admission it is my clinical judgment that the patient will require inpatient hospital care spanning beyond 2 midnights from the point of admission due to high intensity of service, high risk for further deterioration and high frequency of surveillance required.DEWAINE    Ogden Handlin, MD [facility]  How to contact the [facility] Attending or Consulting provider 7A - 7P or covering provider during after hours 7P -7A, for this patient.  Check the care team in [facility] and look for a) attending/consulting [facility] provider listed and b) the [facility] team listed Log into [email or web address] and use [place]'s universal password to access. If you do not have the password, please contact the hospital operator. Locate the [facility] provider you are looking for under [facility] and page to a number that you can be directly reached. If you still have difficulty reaching the provider, please page the [facility] (Director on Call) for the Hospitalists listed on amion for assistance.  03/01/2024, 1:57 AM           [1]  Allergies Allergen Reactions   Codeine Shortness Of Breath   Diclofenac Rash   "

## 2024-03-01 NOTE — Progress Notes (Signed)
 Brief progress note: - Patient was admitted earlier today. - As per H&P done on presentation: Doris Miller is a 64 y.o. female with medical history significant of chronic lymphedema of the bilateral lower extremity, depression, anxiety, GERD, morbid obesity, and essential hypertension presented to emergency department complaining of bilateral lower extremity swelling with weeping edema, associated shortness of breath-dyspnea.  Patient denies orthopnea and nocturnal dyspnea.  Patient also complaining of generalized body ache chills for last 2 days.  Denies any chest pain and palpitation.  Patient reported decreased fluid intake for past few weeks.  Denies any abdominal pain nausea vomiting diarrhea. Patient reports history of recurrent cellulitis of bilateral lower extremities.   Patient reported that over the course of last 2 to 3 days she has been noticed increasing redness, pain and swelling of the bilateral lower extremities with associated chill.  Denies any fever at home. No other complaint at this time.   ED Course:  At presentation to emergency department patient is hemodynamically stable.  Afebrile.  O2 sat 100% room air. Lab work, CBC showing leukocytosis 13.7, low H&H 9.4 and 32 and elevated platelet count 419.  Baseline hemoglobin around 13-14 range. CMP showing low bicarb 15, elevated creatinine 2.97, elevated ALP 195, low GFR 17 and elevated ion gap 16 and evaded BUN 85. Normal proBNP 119. Troponin x 2 within normal range. Respiratory panel negative.   Chest x-ray no acute intrathoracic process.  Large hiatal hernia. X-ray tibia-fibula of bilateral lower extremities:  Diffuse subcutaneous edema throughout the bilateral lower extremities compatible with given history of cellulitis. No subcutaneous gas or radiopaque foreign body. 2. No acute or destructive bony abnormalities. 3. Osteoarthritis of the bilateral knees.   Due to AKI in the ED patient received 1 L of NS bolus.      Hospitalist consulted for further evaluation management of bilateral lower extremity cellulitis, anemia, AKI, anion gap metabolic acidosis and elevated BUN.       Significant labs in the ED: Lab Orders         Resp panel by RT-PCR (RSV, Flu A&B, Covid) Anterior Nasal Swab         Culture, blood (Routine X 2) w Reflex to ID Panel         Comprehensive metabolic panel         CBC with Differential         Pro Brain natriuretic peptide         Urinalysis, Routine w reflex microscopic -Urine, Clean Catch         Creatinine, urine, random         Sodium, urine, random         HIV Antibody (routine testing w rflx)         CBC         Basic metabolic panel         Vitamin B12         Folate         Iron and TIBC         Ferritin         Reticulocytes  03/01/2024: Patient seen.  No new complaints.  Patient is currently on IV vancomycin  and cefepime .  Will continue antibiotics for now.

## 2024-03-01 NOTE — Plan of Care (Signed)

## 2024-03-01 NOTE — Evaluation (Signed)
 Occupational Therapy Evaluation Patient Details Name: Doris Miller MRN: [id number] DOB: 1960/05/16 Today's Date: 03/01/2024   History of Present Illness   Pt is a 64 year old woman who presented to [facility] on 1/ 15/26 with SOB, generalized weakness, B LE swelling/weeping. Pt with AKI. PMH: morbid obesity, GERD, L LE cellulitis, lymphedema, depression, anxiety, HTN.     Clinical Impressions Pt furniture walks in her home and uses an electric scooter in the community. She sits on a make shift seat to shower and does not like to wear socks. She uses slip on shoes. Pt functions modified independently in ADLs and IADLs and drives. Pt lives with her son and extended family lives nearby. Pt presents with generalized weakness, decreased activity tolerance, impaired standing balance and LE pain. She completes bed mobility mod I and stands and transfers with CGA. Pt needs up to min assist for ADLs. Anticipate pt will progress well and not require post acute OT.      If plan is discharge home, recommend the following:   A little help with walking and/or transfers;A little help with bathing/dressing/bathroom;Assistance with cooking/housework;Help with stairs or ramp for entrance     Functional Status Assessment   Patient has had a recent decline in their functional status and demonstrates the ability to make significant improvements in function in a reasonable and predictable amount of time.     Equipment Recommendations   Tub/shower bench;Other (comment) (Rollator?)     Recommendations for Other Services         Precautions/Restrictions         Mobility Bed Mobility Overal bed mobility: Modified Independent             General bed mobility comments: HOB up, able to pull herself up in bed in trendelenburg    Transfers Overall transfer level: Needs assistance Equipment used: None Transfers: Sit to/from Stand, Bed to chair/wheelchair/BSC Sit to Stand: Contact guard  assist Stand pivot transfers: Contact guard assist                Balance Overall balance assessment: Needs assistance   Sitting balance-Leahy Scale: Good       Standing balance-Leahy Scale: Fair                             ADL either performed or assessed with clinical judgement   ADL Overall ADL's : Needs assistance/impaired Eating/Feeding: Independent;Bed level   Grooming: Set up;Sitting   Upper Body Bathing: Set up;Sitting   Lower Body Bathing: Minimal assistance;Sit to/from stand   Upper Body Dressing : Set up;Sitting   Lower Body Dressing: Minimal assistance;Sit to/from stand Lower Body Dressing Details (indicate cue type and reason): does not wear socks at baseline, uses slip on shoes Toilet Transfer: Contact guard assist;Stand-pivot;BSC/3in1   Toileting- Clothing Manipulation and Hygiene: Set up;Sitting/lateral lean         General ADL Comments: Decreased activity tolerance.     Vision Baseline Vision/History: 1 Wears glasses Ability to See in Adequate Light: 0 Adequate Patient Visual Report: No change from baseline       Perception         Praxis         Pertinent Vitals/Pain Pain Assessment Pain Assessment: Faces Faces Pain Scale: Hurts little more Pain Location: LEs Pain Descriptors / Indicators: Discomfort, Grimacing, Guarding Pain Intervention(s): Monitored during session, Repositioned     Extremity/Trunk Assessment Upper Extremity Assessment Upper Extremity Assessment:  Overall [facility] for tasks assessed;Right hand dominant   Lower Extremity Assessment Lower Extremity Assessment: Defer to PT evaluation   Cervical / Trunk Assessment Cervical / Trunk Assessment: Normal;Other exceptions (increased habitus)   Communication Communication Communication: No apparent difficulties   Cognition Arousal: Alert Behavior During Therapy: WFL for tasks assessed/performed Cognition: No apparent impairments                                Following commands: Intact       Cueing  General Comments   Cueing Techniques: Verbal cues      Exercises     Shoulder Instructions      Home Living Family/patient expects to be discharged to:: Private residence Living Arrangements: Spouse/significant other Available Help at Discharge: Family;Available 24 hours/day Type of Home: Mobile home Home Access: Ramped entrance     Home Layout: One level     Bathroom Shower/Tub: Chief Strategy Officer: Handicapped height     Home Equipment: Electric scooter;Grab bars - tub/shower          Prior Functioning/Environment Prior Level of Function : Independent/Modified Independent;Driving             Mobility Comments: furniture walks, uses electric scooter outside of home ADLs Comments: does not wear socks, wears slip on shoes, sits on a make-shift chair to shower, mod independent otherwise    OT Problem List: Decreased activity tolerance;Impaired balance (sitting and/or standing);Pain   OT Treatment/Interventions: Self-care/ADL training;Therapeutic activities;Patient/family education;Balance training;DME and/or AE instruction;Energy conservation      OT Goals(Current goals can be found in the care plan section)   Acute Rehab OT Goals OT Goal Formulation: With patient Time For Goal Achievement: 03/15/24 Potential to Achieve Goals: Good ADL Goals Pt Will Perform Grooming: with modified independence;standing Pt Will Perform Lower Body Bathing: with modified independence;sit to/from stand Pt Will Perform Lower Body Dressing: with modified independence;sit to/from stand Pt Will Transfer to Toilet: with modified independence;ambulating Pt Will Perform Toileting - Clothing Manipulation and hygiene: with modified independence;sit to/from stand Pt Will Perform Tub/Shower Transfer: Tub transfer;with min assist;ambulating;tub bench Additional ADL Goal #1: Pt will state at least 3 energy  conservations strategies as instructed.   OT Frequency:  Min 2X/week    Co-evaluation              AM-PAC OT 6 Clicks Daily Activity     Outcome Measure Help from another person eating meals?: None Help from another person taking care of personal grooming?: A Little Help from another person toileting, which includes using toliet, bedpan, or urinal?: A Little Help from another person bathing (including washing, rinsing, drying)?: A Little Help from another person to put on and taking off regular upper body clothing?: A Little Help from another person to put on and taking off regular lower body clothing?: A Little 6 Click Score: 19   End of Session Nurse Communication: Other (comment) (ok to give crackers and peanut butter)  Activity Tolerance: Patient limited by fatigue Patient left: in bed;with call bell/phone within reach  OT Visit Diagnosis: Unsteadiness on feet (R26.81);Other abnormalities of gait and mobility (R26.89);Pain;Other (comment) (decreased activity tolerance)                Time: 8690-8655 OT Time Calculation (min): 35 min Charges:  OT General Charges $OT Visit: 1 Visit OT Evaluation $OT Eval Moderate Complexity: 1 Mod OT Treatments $Self Care/Home Management : 8-22  mins  Doris Miller, OTR/L Acute Rehabilitation Services Office: [phone]  Kennth Doris Helling 03/01/2024, 3:01 PM

## 2024-03-01 NOTE — ED Notes (Signed)
 Patient shouting out in severe pain. Pt reports to this RN new onset of burning sensation to right foot. Both lower extremities noted to be red and warm to the touch. Pt requests TED hose applied by previous RN to be removed. Pt reports that pain began about a little over half an hour ago. Pt requesting for pain medication other than tylenol . Secure chat sent to Rosario, MD.

## 2024-03-01 NOTE — ED Notes (Signed)
 Katherene Ao, the patient's brother [phone] is requesting an update.

## 2024-03-01 NOTE — Progress Notes (Signed)
 PHARMACY NOTE:  ANTIMICROBIAL RENAL DOSAGE ADJUSTMENT  Current antimicrobial regimen includes a mismatch between antimicrobial dosage and estimated renal function.  As per policy approved by the Pharmacy & Therapeutics and Medical Executive Committees, the antimicrobial dosage will be adjusted accordingly.  Current antimicrobial dosage:  vancomycin  dose per level   Indication: cellulitis   Renal Function:  Estimated Creatinine Clearance: 31 mL/min (A) (by C-G formula based on SCr of 2.83 mg/dL (H)). []      On intermittent HD, scheduled: []      On CRRT    Antimicrobial dosage has been changed to:  Vancomycin  750mg  q24h (eAUC 431, sCr 2.83)  Additional comments:   Thank you for allowing pharmacy to be a part of this patient's care.  Leonor GORMAN Bash, [facility] 03/01/2024 2:42 PM

## 2024-03-01 NOTE — Progress Notes (Signed)
 Pharmacy Antibiotic Note  Doris Miller is a 64 y.o. female admitted on 02/29/2024 with orthopnea/generalized weakness/bilat leg swelling.  Pharmacy has been consulted for cefepime /vancomycin  dosing for cellulitis. PMH includes cellulitis of left leg.  -WBC 13, sCr 2.97 (bl~1), afebrile -2022 WoundCx: MSSA   Plan: -Cefepime  2g IV every 24 hours -Vancomycin  2250mg  IV x1 -Vanc variable given AKI -Monitor renal function for antibiotic dose adjustments  -Follow up signs of clinical improvement, LOT, de-escalation of antibiotics   Height: 5' 5 (165.1 cm) Weight: (!) 150.1 kg (331 lb) IBW/kg (Calculated) : 57  Temp (24hrs), Avg:98.2 F (36.8 C), Min:97.3 F (36.3 C), Max:99.3 F (37.4 C)  Recent Labs  Lab 02/29/24 1906 02/29/24 1923  WBC 13.7*  --   CREATININE 2.97*  --   LATICACIDVEN  --  1.4    Estimated Creatinine Clearance: 29.6 mL/min (A) (by C-G formula based on SCr of 2.97 mg/dL (H)).    Allergies[1]  Antimicrobials this admission: Cefepime  1/16 >>  Vancomycin  1/16 >>   Microbiology results: 1/16 BCx: ordered  Thank you for allowing pharmacy to be a part of this patients care.  Lynwood Poplar, PharmD, BCPS Clinical Pharmacist 03/01/2024 1:51 AM       [1]  Allergies Allergen Reactions   Codeine Shortness Of Breath   Diclofenac Rash

## 2024-03-01 NOTE — ED Notes (Signed)
 Pt placed on hospital bed

## 2024-03-02 DIAGNOSIS — N179 Acute kidney failure, unspecified: Secondary | ICD-10-CM | POA: Diagnosis not present

## 2024-03-02 LAB — MAGNESIUM: Magnesium: 1.6 mg/dL — ABNORMAL LOW (ref 1.7–2.4)

## 2024-03-02 LAB — CBC WITH DIFFERENTIAL/PLATELET
Abs Immature Granulocytes: 0.19 K/uL — ABNORMAL HIGH (ref 0.00–0.07)
Basophils Absolute: 0.1 K/uL (ref 0.0–0.1)
Basophils Relative: 1 %
Eosinophils Absolute: 0.1 K/uL (ref 0.0–0.5)
Eosinophils Relative: 1 %
HCT: 29.1 % — ABNORMAL LOW (ref 36.0–46.0)
Hemoglobin: 8.8 g/dL — ABNORMAL LOW (ref 12.0–15.0)
Immature Granulocytes: 1 %
Lymphocytes Relative: 12 %
Lymphs Abs: 1.6 K/uL (ref 0.7–4.0)
MCH: 25.1 pg — ABNORMAL LOW (ref 26.0–34.0)
MCHC: 30.2 g/dL (ref 30.0–36.0)
MCV: 83.1 fL (ref 80.0–100.0)
Monocytes Absolute: 0.9 K/uL (ref 0.1–1.0)
Monocytes Relative: 7 %
Neutro Abs: 10.5 K/uL — ABNORMAL HIGH (ref 1.7–7.7)
Neutrophils Relative %: 78 %
Platelets: 375 K/uL (ref 150–400)
RBC: 3.5 MIL/uL — ABNORMAL LOW (ref 3.87–5.11)
RDW: 19.3 % — ABNORMAL HIGH (ref 11.5–15.5)
WBC: 13.3 K/uL — ABNORMAL HIGH (ref 4.0–10.5)
nRBC: 0 % (ref 0.0–0.2)

## 2024-03-02 LAB — BASIC METABOLIC PANEL WITH GFR
Anion gap: 13 (ref 5–15)
BUN: 61 mg/dL — ABNORMAL HIGH (ref 8–23)
CO2: 14 mmol/L — ABNORMAL LOW (ref 22–32)
Calcium: 9.6 mg/dL (ref 8.9–10.3)
Chloride: 111 mmol/L (ref 98–111)
Creatinine, Ser: 2.3 mg/dL — ABNORMAL HIGH (ref 0.44–1.00)
GFR, Estimated: 23 mL/min — ABNORMAL LOW
Glucose, Bld: 132 mg/dL — ABNORMAL HIGH (ref 70–99)
Potassium: 3.5 mmol/L (ref 3.5–5.1)
Sodium: 138 mmol/L (ref 135–145)

## 2024-03-02 MED ORDER — POTASSIUM CHLORIDE 20 MEQ PO PACK
40.0000 meq | PACK | Freq: Once | ORAL | Status: AC
  Administered 2024-03-02: 40 meq via ORAL
  Filled 2024-03-02: qty 2

## 2024-03-02 MED ORDER — SODIUM BICARBONATE 8.4 % IV SOLN
INTRAVENOUS | Status: DC
  Filled 2024-03-02 (×4): qty 1000

## 2024-03-02 NOTE — Progress Notes (Signed)
 " PROGRESS NOTE    Doris Miller  FMW:[id number] DOB: [birth year] DOA: 02/29/2024 PCP: Terry Wilhelmena Lloyd Hilario, FNP  Outpatient Specialists:     Brief Narrative:  As per H&P: Doris Miller is a 64 y.o. female with medical history significant of chronic lymphedema of the bilateral lower extremity, depression, anxiety, GERD, morbid obesity, and essential hypertension presented to emergency department complaining of bilateral lower extremity swelling with weeping edema, associated shortness of breath-dyspnea.  Patient denies orthopnea and nocturnal dyspnea.  Patient also complaining of generalized body ache chills for last 2 days.  Denies any chest pain and palpitation.  Patient reported decreased fluid intake for past few weeks.  Denies any abdominal pain nausea vomiting diarrhea. Patient reports history of recurrent cellulitis of bilateral lower extremities.   Patient reported that over the course of last 2 to 3 days she has been noticed increasing redness, pain and swelling of the bilateral lower extremities with associated chill.  Denies any fever at home. No other complaint at this time.   ED Course:  At presentation to emergency department patient is hemodynamically stable.  Afebrile.  O2 sat 100% room air. Lab work, CBC showing leukocytosis 13.7, low H&H 9.4 and 32 and elevated platelet count 419.  Baseline hemoglobin around 13-14 range. CMP showing low bicarb 15, elevated creatinine 2.97, elevated ALP 195, low GFR 17 and elevated ion gap 16 and evaded BUN 85. Normal proBNP 119. Troponin x 2 within normal range. Respiratory panel negative.   Chest x-ray no acute intrathoracic process.  Large hiatal hernia. X-ray tibia-fibula of bilateral lower extremities:  Diffuse subcutaneous edema throughout the bilateral lower extremities compatible with given history of cellulitis. No subcutaneous gas or radiopaque foreign body. 2. No acute or destructive bony abnormalities. 3.  Osteoarthritis of the bilateral knees.  03/02/2024: Patient seen.  No new complaints.  Cellulitis of bilateral lower legs is slowly improving.  Renal panel done today reveals CO2 of 14, blood sugar of 132, BUN of 61 and serum creatinine of 2.3.  CBC revealed WBC of 13.3, hemoglobin of 8.8 and MCV of 375.     Assessment & Plan:   Principal Problem:   AKI (acute kidney injury) Active Problems:   Morbid obesity with body mass index (BMI) of 50.0 to 59.9 in adult [facility])   GERD (gastroesophageal reflux disease)   Lower extremity cellulitis   Essential hypertension   Metabolic acidosis   Normocytic anemia   Chronic acquired lymphedema   Anxiety and depression   Acute kidney injury   Acute kidney injury Metabolic acidosis Elevated BUN -Patient presented emergency department complaining of worsening bilateral lower extremity swelling with associated redness, tenderness on palpation and chills at home.  Patient also stated poor appetite and poor oral intake of fluids. Presentation to ED patient is hemodynamically stable except blood pressure borderline soft. - Baseline serum creatinine of 1.39. - Serum creatinine of 2.97 on presentation.  Serum creatinine is slowly improving. - Serum creatinine of 2.3 today. -CO2 of 14 noted today. - Start patient on bicarb drip (D5 water +3 A of bicarb (at 75 cc/h.   - Continue to monitor renal function and electrolytes.   Bilateral lower extremity cellulitis SIRS -Patient has history of chronic lymphedema of the bilateral lower extremities.  Physical exam revealed erythema and tenderness of the bilateral lower extremities and warm to touch. - WBC of 13.3 today. - Cellulitis is improving. - Continue antibiotics.     Chronic lymphedema -Encourage limb elevation and ambulation  with assistance as patient tolerates.     History of GERD -Continue Protonix    Essential hypertension - Controlled.  Normocytic anemia -Baseline hemoglobin around  13-14.  Hemoglobin today 9.4 and hematocrit 32.  Normal MCV.  Checking anemia panel.  Patient denies any history of GI bleed.  Continue to monitor H&H 03/02/2024: Hemoglobin of 8.8 g/dL today.   Anxiety and depression -Continue Lexapro    Morbid obesity high BMI 55 - Diet and exercise. - Further management by PCP on discharge.  DVT prophylaxis: Subcutaneous heparin  Code Status: Full code Family Communication:  Disposition Plan:    Consultants:  None.  Procedures:  None.  Antimicrobials:  IV vancomycin  IV cefepime .   Subjective: No new complaints.  Cellulitis is improving.  Objective: Vitals:   03/01/24 1520 03/01/24 2109 03/02/24 0509 03/02/24 0805  BP: 108/64 106/60 (!) 103/56 (!) 113/56  Pulse: 90 98 89 92  Resp:  18 14   Temp:  98 F (36.7 C) 98.1 F (36.7 C) (!) 97.3 F (36.3 C)  TempSrc:   Oral   SpO2: 99% 97% 99% 100%  Weight:      Height:        Intake/Output Summary (Last 24 hours) at 03/02/2024 1054 Last data filed at 03/01/2024 1748 Gross per 24 hour  Intake 1490.29 ml  Output --  Net 1490.29 ml   Filed Weights   02/29/24 1856  Weight: (!) 150.1 kg    Examination:  General exam: Appears calm and comfortable.  Patient is morbidly obese. Respiratory system: Clear to auscultation.  Cardiovascular system: S1 & S2 heard Gastrointestinal system: Abdomen is obese, soft and nontender. Central nervous system: Awake and alert.   Extremities: Bilateral lymphedema.. Skin: Cellulitis bilateral lower legs, improving.  Data Reviewed: I have personally reviewed following labs and imaging studies  CBC: Recent Labs  Lab 02/29/24 1906 03/01/24 0231  WBC 13.7* 12.6*  NEUTROABS 10.7*  --   HGB 9.4* 8.4*  HCT 32.0* 28.0*  MCV 85.6 83.6  PLT 419* 389   Basic Metabolic Panel: Recent Labs  Lab 02/29/24 1906 03/01/24 0231 03/02/24 0937  NA 139 137 138  K 4.1 3.6 3.5  CL 109 110 111  CO2 15* 15* 14*  GLUCOSE 93 103* 132*  BUN 85* 79* 61*   CREATININE 2.97* 2.83* 2.30*  CALCIUM 9.8 9.3 9.6   GFR: Estimated Creatinine Clearance: 38.2 mL/min (A) (by C-G formula based on SCr of 2.3 mg/dL (H)). Liver Function Tests: Recent Labs  Lab 02/29/24 1906  AST 14*  ALT 11  ALKPHOS 195*  BILITOT 0.2  PROT 6.7  ALBUMIN 3.2*   No results for input(s): LIPASE, AMYLASE in the last 168 hours. No results for input(s): AMMONIA in the last 168 hours. Coagulation Profile: No results for input(s): INR, PROTIME in the last 168 hours. Cardiac Enzymes: No results for input(s): CKTOTAL, CKMB, CKMBINDEX, TROPONINI in the last 168 hours. BNP (last 3 results) Recent Labs    02/29/24 1906  PROBNP 119.0   HbA1C: No results for input(s): HGBA1C in the last 72 hours. CBG: No results for input(s): GLUCAP in the last 168 hours. Lipid Profile: No results for input(s): CHOL, HDL, LDLCALC, TRIG, CHOLHDL, LDLDIRECT in the last 72 hours. Thyroid  Function Tests: No results for input(s): TSH, T4TOTAL, FREET4, T3FREE, THYROIDAB in the last 72 hours. Anemia Panel: Recent Labs    03/01/24 0220  VITAMINB12 281  FOLATE 5.9*  FERRITIN 112  TIBC 273  IRON 24*  RETICCTPCT 1.4  Urine analysis:    Component Value Date/Time   COLORURINE YELLOW 03/01/2024 0528   APPEARANCEUR CLOUDY (A) 03/01/2024 0528   LABSPEC 1.015 03/01/2024 0528   PHURINE 5.0 03/01/2024 0528   GLUCOSEU NEGATIVE 03/01/2024 0528   HGBUR MODERATE (A) 03/01/2024 0528   BILIRUBINUR NEGATIVE 03/01/2024 0528   KETONESUR NEGATIVE 03/01/2024 0528   PROTEINUR NEGATIVE 03/01/2024 0528   UROBILINOGEN 0.2 06/15/2011 2011   NITRITE NEGATIVE 03/01/2024 0528   LEUKOCYTESUR LARGE (A) 03/01/2024 0528   Sepsis Labs: @LABRCNTIP (procalcitonin:4,lacticidven:4)  ) Recent Results (from the past 240 hours)  Resp panel by RT-PCR (RSV, Flu A&B, Covid) Anterior Nasal Swab     Status: None   Collection Time: 02/29/24  7:13 PM   Specimen: Anterior  Nasal Swab  Result Value Ref Range Status   SARS Coronavirus 2 by RT PCR NEGATIVE NEGATIVE Final   Influenza A by PCR NEGATIVE NEGATIVE Final   Influenza B by PCR NEGATIVE NEGATIVE Final    Comment: (NOTE) The Xpert Xpress SARS-CoV-2/FLU/RSV plus assay is intended as an aid in the diagnosis of influenza from Nasopharyngeal swab specimens and should not be used as a sole basis for treatment. Nasal washings and aspirates are unacceptable for Xpert Xpress SARS-CoV-2/FLU/RSV testing.  Fact Sheet for Patients: bloggercourse.com  Fact Sheet for Healthcare Providers: seriousbroker.it  This test is not yet approved or cleared by the [place]  FDA and has been authorized for detection and/or diagnosis of SARS-CoV-2 by FDA under an Emergency Use Authorization (EUA). This EUA will remain in effect (meaning this test can be used) for the duration of the COVID-19 declaration under Section 564(b)(1) of the Act, 21 U.S.C. section 360bbb-3(b)(1), unless the authorization is terminated or revoked.     Resp Syncytial Virus by PCR NEGATIVE NEGATIVE Final    Comment: (NOTE) Fact Sheet for Patients: bloggercourse.com  Fact Sheet for Healthcare Providers: seriousbroker.it  This test is not yet approved or cleared by the [place]  FDA and has been authorized for detection and/or diagnosis of SARS-CoV-2 by FDA under an Emergency Use Authorization (EUA). This EUA will remain in effect (meaning this test can be used) for the duration of the COVID-19 declaration under Section 564(b)(1) of the Act, 21 U.S.C. section 360bbb-3(b)(1), unless the authorization is terminated or revoked.  Performed at [facility], [address], [place], [place] [address]   Culture, blood (Routine X 2) w Reflex to ID Panel     Status: None (Preliminary result)   Collection Time: 03/01/24 12:30 AM    Specimen: BLOOD RIGHT ARM  Result Value Ref Range Status   Specimen Description BLOOD RIGHT ARM  Final   Special Requests   Final    BOTTLES DRAWN AEROBIC AND ANAEROBIC Blood Culture adequate volume   Culture   Final    NO GROWTH 1 DAY Performed at [facility], [address], [place], [place] [address]    Report Status PENDING  Incomplete  Culture, blood (Routine X 2) w Reflex to ID Panel     Status: None (Preliminary result)   Collection Time: 03/01/24  2:11 AM   Specimen: BLOOD LEFT HAND  Result Value Ref Range Status   Specimen Description BLOOD LEFT HAND  Final   Special Requests   Final    BOTTLES DRAWN AEROBIC ONLY Blood Culture results may not be optimal due to an inadequate volume of blood received in culture bottles   Culture   Final    NO GROWTH 1 DAY Performed  at [facility], [address], [place], [place] [address]    Report Status PENDING  Incomplete         Radiology Studies: DG Tibia/Fibula Left Result Date: 02/29/2024 CLINICAL DATA:  Bilateral lower extremity edema, cellulitis EXAM: LEFT TIBIA AND FIBULA - 2 VIEW; RIGHT TIBIA AND FIBULA - 2 VIEW COMPARISON:  12/09/2003 FINDINGS: Left tibia/fibula: Frontal and lateral views are obtained. There is severe diffuse subcutaneous edema. No subcutaneous gas or radiopaque foreign body. There are no acute or destructive bony abnormalities. Moderate 3 compartmental left knee osteoarthritis. Right tibia/fibula: Frontal and lateral views are obtained. There is severe diffuse subcutaneous edema. No subcutaneous gas or radiopaque foreign body. There are no acute or destructive bony abnormalities. Moderate 3 compartmental right knee osteoarthritis. IMPRESSION: 1. Diffuse subcutaneous edema throughout the bilateral lower extremities compatible with given history of cellulitis. No subcutaneous gas or radiopaque foreign body. 2. No acute or destructive bony abnormalities. 3. Osteoarthritis of the bilateral knees.  Electronically Signed   By: Ozell Daring M.D.   On: 02/29/2024 20:07   DG Tibia/Fibula Right Result Date: 02/29/2024 CLINICAL DATA:  Bilateral lower extremity edema, cellulitis EXAM: LEFT TIBIA AND FIBULA - 2 VIEW; RIGHT TIBIA AND FIBULA - 2 VIEW COMPARISON:  12/09/2003 FINDINGS: Left tibia/fibula: Frontal and lateral views are obtained. There is severe diffuse subcutaneous edema. No subcutaneous gas or radiopaque foreign body. There are no acute or destructive bony abnormalities. Moderate 3 compartmental left knee osteoarthritis. Right tibia/fibula: Frontal and lateral views are obtained. There is severe diffuse subcutaneous edema. No subcutaneous gas or radiopaque foreign body. There are no acute or destructive bony abnormalities. Moderate 3 compartmental right knee osteoarthritis. IMPRESSION: 1. Diffuse subcutaneous edema throughout the bilateral lower extremities compatible with given history of cellulitis. No subcutaneous gas or radiopaque foreign body. 2. No acute or destructive bony abnormalities. 3. Osteoarthritis of the bilateral knees. Electronically Signed   By: Ozell Daring M.D.   On: 02/29/2024 20:07   DG Chest 2 View Result Date: 02/29/2024 CLINICAL DATA:  Shortness of breath, cellulitis, lower extremity edema EXAM: CHEST - 2 VIEW COMPARISON:  06/15/2011 FINDINGS: Frontal and lateral views of the chest demonstrate an unremarkable cardiac silhouette. Large hiatal hernia. No airspace disease, effusion, or pneumothorax. No acute bony abnormalities. IMPRESSION: 1. No acute intrathoracic process. 2. Large hiatal hernia. Electronically Signed   By: Ozell Daring M.D.   On: 02/29/2024 20:06        Scheduled Meds:  escitalopram   20 mg Oral Daily   heparin   5,000 Units Subcutaneous Q8H   lamoTRIgine   25 mg Oral Daily   pantoprazole   40 mg Oral Daily   potassium chloride   40 mEq Oral Once   sodium chloride  flush  3 mL Intravenous Q12H   sodium chloride  flush  3 mL Intravenous Q12H    Continuous Infusions:  ceFEPime  (MAXIPIME ) IV 2 g (03/02/24 0607)   sodium bicarbonate  150 mEq in dextrose  5 % 1,150 mL infusion     vancomycin  750 mg (03/02/24 0418)     LOS: 1 day    Time spent: 55 minutes.    Leatrice Chapel, MD  [facility] 7PM-7AM contact night coverage as above    "

## 2024-03-02 NOTE — Plan of Care (Signed)

## 2024-03-02 NOTE — Evaluation (Signed)
 Physical Therapy Evaluation Patient Details Name: Doris Miller MRN: [id number] DOB: 11/12/1960 Today's Date: 03/02/2024  History of Present Illness  64 y.o. female admitted 02/29/24 with SOB, weakness, BLE swelling/weeping. Workup for AKI, BLE cellulitis. PMH includes obesity, cellilitis, lymphedema, HTN, depression, anxiety.  Clinical Impression  Pt presents with an overall decrease in functional mobility secondary to above. PTA, pt mod indep limited household ambulator, uses electric scooter outside, lives with family. Today, pt able to ambulate short distances with RW at supervision-level; limited by BLE pain and baseline DOE. Pt reports no questions or concerns, interested in RW for use at home. Will follow acutely to address established goals.     SpO2 98% on RA with activity, HR 95     If plan is discharge home, recommend the following: Assist for transportation;Assistance with cooking/housework   Can travel by private vehicle    Yes    Equipment Recommendations Rolling walker (2 wheels)  Recommendations for Other Services       Functional Status Assessment Patient has had a recent decline in their functional status and demonstrates the ability to make significant improvements in function in a reasonable and predictable amount of time.     Precautions / Restrictions Precautions Precautions: Fall Recall of Precautions/Restrictions: Intact Restrictions Weight Bearing Restrictions Per Provider Order: No      Mobility  Bed Mobility Overal bed mobility: Modified Independent             General bed mobility comments: HOB elevated    Transfers Overall transfer level: Modified independent Equipment used: Rolling walker (2 wheels), None Transfers: Sit to/from Stand             General transfer comment: mod indep 2x sit<>stand from EOB to RW; mod indep standing from low recliner height without DME, use of grab bar to pull up    Ambulation/Gait Ambulation/Gait  assistance: Supervision Gait Distance (Feet): 36 Feet Assistive device: Rolling walker (2 wheels) Gait Pattern/deviations: Step-to pattern, Step-through pattern, Decreased stride length, Wide base of support, Antalgic, Trunk flexed Gait velocity: Decreased     General Gait Details: slow, antalgic gait with RW and supervision for safety/lines; seated rest break secondary to pain and SOB; pt reports DOE baseline  Careers Information Officer     Tilt Bed    Modified Rankin (Stroke Patients Only)       Balance Overall balance assessment: Needs assistance Sitting-balance support: No upper extremity supported Sitting balance-Leahy Scale: Good Sitting balance - Comments: pt performing pericare without assist   Standing balance support: No upper extremity supported, During functional activity Standing balance-Leahy Scale: Fair                               Pertinent Vitals/Pain Pain Assessment Pain Assessment: Faces Faces Pain Scale: Hurts little more Pain Location: BLEs Pain Descriptors / Indicators: Discomfort, Grimacing Pain Intervention(s): Monitored during session, Limited activity within patient's tolerance    Home Living Family/patient expects to be discharged to:: Private residence Living Arrangements: Spouse/significant other Available Help at Discharge: Family;Available 24 hours/day Type of Home: Mobile home Home Access: Ramped entrance       Home Layout: One level Home Equipment: Electric scooter;Grab bars - tub/shower      Prior Function Prior Level of Function : Independent/Modified Independent;Driving             Mobility Comments: limited  household ambulator via sports coach; uses art gallery manager outside of home ADLs Comments: does not wear socks, wears slip on shoes, sits on a make-shift chair to shower, mod independent otherwise     Extremity/Trunk Assessment   Upper Extremity Assessment Upper Extremity  Assessment: Overall WFL for tasks assessed;Right hand dominant    Lower Extremity Assessment Lower Extremity Assessment: RLE deficits/detail;LLE deficits/detail RLE Deficits / Details: functional observed strength >/ 3/5; notable swelling and redness lower legs/ankles LLE Deficits / Details: functional observed strength >/ 3/5; notable swelling and redness lower legs/ankles       Communication   Communication Communication: No apparent difficulties    Cognition Arousal: Alert Behavior During Therapy: WFL for tasks assessed/performed   PT - Cognitive impairments: No apparent impairments                         Following commands: Intact       Cueing Cueing Techniques: Verbal cues     General Comments General comments (skin integrity, edema, etc.): SpO2 98% on RA with activity, HR 95. educ re: role of acute PT, POC, activity recommendations, energy conservation strategies, potential d/c needs including DME use; pt interested in RW for home    Exercises     Assessment/Plan    PT Assessment Patient needs continued PT services  PT Problem List Decreased strength;Decreased activity tolerance;Decreased balance;Decreased mobility;Decreased knowledge of use of DME;Pain       PT Treatment Interventions DME instruction;Gait training;Functional mobility training;Therapeutic activities;Therapeutic exercise;Balance training;Patient/family education    PT Goals (Current goals can be found in the Care Plan section)  Acute Rehab PT Goals Patient Stated Goal: return home soon PT Goal Formulation: With patient Time For Goal Achievement: 03/16/24 Potential to Achieve Goals: Good    Frequency Min 1X/week     Co-evaluation               AM-PAC PT 6 Clicks Mobility  Outcome Measure Help needed turning from your back to your side while in a flat bed without using bedrails?: A Little Help needed moving from lying on your back to sitting on the side of a flat bed  without using bedrails?: A Little Help needed moving to and from a bed to a chair (including a wheelchair)?: None Help needed standing up from a chair using your arms (e.g., wheelchair or bedside chair)?: None Help needed to walk in hospital room?: A Little Help needed climbing 3-5 steps with a railing? : A Little 6 Click Score: 20    End of Session   Activity Tolerance: Patient tolerated treatment well Patient left: in bed;with call bell/phone within reach;with bed alarm set Nurse Communication: Mobility status PT Visit Diagnosis: Other abnormalities of gait and mobility (R26.89)    Time: 9259-9244 PT Time Calculation (min) (ACUTE ONLY): 15 min   Charges:   PT Evaluation $PT Eval Moderate Complexity: 1 Mod   PT General Charges $$ ACUTE PT VISIT: 1 Visit    Darice Almas, PT, DPT Acute Rehabilitation Services  Personal: Secure Chat Rehab Office: [phone]  Darice LITTIE Almas 03/02/2024, 9:38 AM

## 2024-03-03 LAB — CBC WITH DIFFERENTIAL/PLATELET
Abs Immature Granulocytes: 0.16 K/uL — ABNORMAL HIGH (ref 0.00–0.07)
Basophils Absolute: 0.1 K/uL (ref 0.0–0.1)
Basophils Relative: 1 %
Eosinophils Absolute: 0.1 K/uL (ref 0.0–0.5)
Eosinophils Relative: 1 %
HCT: 29.7 % — ABNORMAL LOW (ref 36.0–46.0)
Hemoglobin: 9.2 g/dL — ABNORMAL LOW (ref 12.0–15.0)
Immature Granulocytes: 2 %
Lymphocytes Relative: 12 %
Lymphs Abs: 1.3 K/uL (ref 0.7–4.0)
MCH: 25 pg — ABNORMAL LOW (ref 26.0–34.0)
MCHC: 31 g/dL (ref 30.0–36.0)
MCV: 80.7 fL (ref 80.0–100.0)
Monocytes Absolute: 0.8 K/uL (ref 0.1–1.0)
Monocytes Relative: 8 %
Neutro Abs: 8.6 K/uL — ABNORMAL HIGH (ref 1.7–7.7)
Neutrophils Relative %: 76 %
Platelets: 326 K/uL (ref 150–400)
RBC: 3.68 MIL/uL — ABNORMAL LOW (ref 3.87–5.11)
RDW: 19.1 % — ABNORMAL HIGH (ref 11.5–15.5)
WBC: 11 K/uL — ABNORMAL HIGH (ref 4.0–10.5)
nRBC: 0 % (ref 0.0–0.2)

## 2024-03-03 LAB — RENAL FUNCTION PANEL
Albumin: 2.9 g/dL — ABNORMAL LOW (ref 3.5–5.0)
Anion gap: 16 — ABNORMAL HIGH (ref 5–15)
BUN: 51 mg/dL — ABNORMAL HIGH (ref 8–23)
CO2: 14 mmol/L — ABNORMAL LOW (ref 22–32)
Calcium: 9.7 mg/dL (ref 8.9–10.3)
Chloride: 109 mmol/L (ref 98–111)
Creatinine, Ser: 2.14 mg/dL — ABNORMAL HIGH (ref 0.44–1.00)
GFR, Estimated: 26 mL/min — ABNORMAL LOW
Glucose, Bld: 97 mg/dL (ref 70–99)
Phosphorus: 1.8 mg/dL — ABNORMAL LOW (ref 2.5–4.6)
Potassium: 3.8 mmol/L (ref 3.5–5.1)
Sodium: 139 mmol/L (ref 135–145)

## 2024-03-03 LAB — BLOOD GAS, VENOUS
Acid-base deficit: 3 mmol/L — ABNORMAL HIGH (ref 0.0–2.0)
Bicarbonate: 22.7 mmol/L (ref 20.0–28.0)
Drawn by: 59101
O2 Saturation: 47.3 %
Patient temperature: 36.8
pCO2, Ven: 42 mmHg — ABNORMAL LOW (ref 44–60)
pH, Ven: 7.34 (ref 7.25–7.43)
pO2, Ven: <31 mmHg — CL (ref 32–45)

## 2024-03-03 LAB — VANCOMYCIN, RANDOM: Vancomycin Rm: 27 ug/mL

## 2024-03-03 LAB — MAGNESIUM: Magnesium: 1.5 mg/dL — ABNORMAL LOW (ref 1.7–2.4)

## 2024-03-03 MED ORDER — SODIUM BICARBONATE 8.4 % IV SOLN
50.0000 meq | Freq: Once | INTRAVENOUS | Status: AC
  Administered 2024-03-03: 50 meq via INTRAVENOUS
  Filled 2024-03-03: qty 50

## 2024-03-03 MED ORDER — K PHOS MONO-SOD PHOS DI & MONO 155-852-130 MG PO TABS
250.0000 mg | ORAL_TABLET | Freq: Four times a day (QID) | ORAL | Status: AC
  Administered 2024-03-03 – 2024-03-04 (×4): 250 mg via ORAL
  Filled 2024-03-03 (×4): qty 1

## 2024-03-03 MED ORDER — VANCOMYCIN HCL IN DEXTROSE 1-5 GM/200ML-% IV SOLN
1000.0000 mg | INTRAVENOUS | Status: DC
  Administered 2024-03-04: 1000 mg via INTRAVENOUS
  Filled 2024-03-03: qty 200

## 2024-03-03 MED ORDER — MAGNESIUM SULFATE 4 GM/100ML IV SOLN
4.0000 g | Freq: Once | INTRAVENOUS | Status: AC
  Administered 2024-03-03: 4 g via INTRAVENOUS
  Filled 2024-03-03: qty 100

## 2024-03-03 MED ORDER — OXYCODONE HCL 5 MG PO TABS
5.0000 mg | ORAL_TABLET | Freq: Four times a day (QID) | ORAL | Status: DC | PRN
  Administered 2024-03-03 – 2024-03-04 (×2): 5 mg via ORAL
  Filled 2024-03-03 (×3): qty 1

## 2024-03-03 NOTE — Progress Notes (Signed)
 RT note. RT attempt x3 for ABG without success.  VBG collected at this time, MD ok with. Order placed, VBG sent to lab, Lab notified. RT will continue to monitor.

## 2024-03-03 NOTE — Progress Notes (Signed)
 " PROGRESS NOTE    Doris Miller  FMW:[id number] DOB: 06/07/1960 DOA: 02/29/2024 PCP: Terry Wilhelmena Lloyd Hilario, FNP  Outpatient Specialists:     Brief Narrative:  As per H&P: Doris Miller is a 64 y.o. female with medical history significant of chronic lymphedema of the bilateral lower extremity, depression, anxiety, GERD, morbid obesity, and essential hypertension presented to emergency department complaining of bilateral lower extremity swelling with weeping edema, associated shortness of breath-dyspnea.  Patient denies orthopnea and nocturnal dyspnea.  Patient also complaining of generalized body ache chills for last 2 days.  Denies any chest pain and palpitation.  Patient reported decreased fluid intake for past few weeks.  Denies any abdominal pain nausea vomiting diarrhea. Patient reports history of recurrent cellulitis of bilateral lower extremities.   Patient reported that over the course of last 2 to 3 days she has been noticed increasing redness, pain and swelling of the bilateral lower extremities with associated chill.  Denies any fever at home. No other complaint at this time.   ED Course:  At presentation to emergency department patient is hemodynamically stable.  Afebrile.  O2 sat 100% room air. Lab work, CBC showing leukocytosis 13.7, low H&H 9.4 and 32 and elevated platelet count 419.  Baseline hemoglobin around 13-14 range. CMP showing low bicarb 15, elevated creatinine 2.97, elevated ALP 195, low GFR 17 and elevated ion gap 16 and evaded BUN 85. Normal proBNP 119. Troponin x 2 within normal range. Respiratory panel negative.   Chest x-ray no acute intrathoracic process.  Large hiatal hernia. X-ray tibia-fibula of bilateral lower extremities:  Diffuse subcutaneous edema throughout the bilateral lower extremities compatible with given history of cellulitis. No subcutaneous gas or radiopaque foreign body. 2. No acute or destructive bony abnormalities. 3.  Osteoarthritis of the bilateral knees.  03/02/2024: Patient seen.  No new complaints.  Cellulitis of bilateral lower legs is slowly improving.  Renal panel done today reveals CO2 of 14, blood sugar of 132, BUN of 61 and serum creatinine of 2.3.  CBC revealed WBC of 13.3, hemoglobin of 8.8 and MCV of 375.  03/03/2024: Patient seen.  Cellulitis of lower legs is slowly improving.  Metabolic acidosis persists.  Continue bicarb drip.  Also gave 1 amp of sodium bicarb.  Check ABG if possible.  Magnesium  of 1.5 noted.  Phosphorus of 1.8.  Serum creatinine has improved to 2.14.  Will give IV magnesium  4 g x 1 dose.  Will also give Neutra-Phos.  Continue to monitor renal function and electrolytes.  Avoid nephrotoxins.  Likely discharge back home tomorrow on oral antibiotics.  Assessment & Plan:   Principal Problem:   AKI (acute kidney injury) Active Problems:   Morbid obesity with body mass index (BMI) of 50.0 to 59.9 in adult [facility])   GERD (gastroesophageal reflux disease)   Lower extremity cellulitis   Essential hypertension   Metabolic acidosis   Normocytic anemia   Chronic acquired lymphedema   Anxiety and depression   Acute kidney injury   Acute kidney injury Metabolic acidosis Elevated BUN -Patient presented emergency department complaining of worsening bilateral lower extremity swelling with associated redness, tenderness on palpation and chills at home.  Patient also stated poor appetite and poor oral intake of fluids. Presentation to ED patient is hemodynamically stable except blood pressure borderline soft. - Baseline serum creatinine of 1.39. - Serum creatinine of 2.97 on presentation.  Serum creatinine is slowly improving. - Serum creatinine of 2.14 today. -CO2 of 14 noted today. -  Continue bicarb drip. - Continue to monitor renal function and electrolytes. - Avoid nephrotoxins.    Hypomagnesemia: - Magnesium  of 1.5. - IV magnesium  4 g x 1 dose.  Hypophosphatemia: -  Phosphorus of 1.8. - Neutra-Phos 250 mg Q6 hourly x 5 doses.   Bilateral lower extremity cellulitis SIRS -Patient has history of chronic lymphedema of the bilateral lower extremities.  Physical exam revealed erythema and tenderness of the bilateral lower extremities and warm to touch. - WBC of 11 today.   - Cellulitis is improving. - Continue antibiotics.     Chronic lymphedema -Encourage limb elevation and ambulation with assistance as patient tolerates.     History of GERD -Continue Protonix    Essential hypertension - Controlled.  Normocytic anemia -Baseline hemoglobin around 13-14.  Hemoglobin today 9.4 and hematocrit 32.  Normal MCV.  Checking anemia panel.  Patient denies any history of GI bleed.  Continue to monitor H&H 03/02/2024: Hemoglobin of 8.8 g/dL today.   Anxiety and depression -Continue Lexapro    Morbid obesity high BMI 55 - Diet and exercise. - Further management by PCP on discharge.  DVT prophylaxis: Subcutaneous heparin  Code Status: Full code Family Communication:  Disposition Plan:    Consultants:  None.  Procedures:  None.  Antimicrobials:  IV vancomycin  IV cefepime .   Subjective: No new complaints.  Cellulitis is improving.  Objective: Vitals:   03/02/24 2044 03/02/24 2359 03/03/24 0506 03/03/24 0756  BP: (!) 85/50 (!) 97/53 (!) 110/59 104/61  Pulse: 99 98 94 92  Resp: 18 19 18    Temp: 98.2 F (36.8 C) 97.8 F (36.6 C) 98.4 F (36.9 C) 98.2 F (36.8 C)  TempSrc:      SpO2: 97% 98% 99% 98%  Weight:      Height:       No intake or output data in the 24 hours ending 03/03/24 1119  Filed Weights   02/29/24 1856  Weight: (!) 150.1 kg    Examination:  General exam: Appears calm and comfortable.  Patient is morbidly obese. Respiratory system: Clear to auscultation.  Cardiovascular system: S1 & S2 heard Gastrointestinal system: Abdomen is obese, soft and nontender. Central nervous system: Awake and alert.   Extremities:  Bilateral lymphedema.. Skin: Cellulitis bilateral lower legs, improving.  Data Reviewed: I have personally reviewed following labs and imaging studies  CBC: Recent Labs  Lab 02/29/24 1906 03/01/24 0231 03/02/24 0937 03/03/24 0402  WBC 13.7* 12.6* 13.3* 11.0*  NEUTROABS 10.7*  --  10.5* 8.6*  HGB 9.4* 8.4* 8.8* 9.2*  HCT 32.0* 28.0* 29.1* 29.7*  MCV 85.6 83.6 83.1 80.7  PLT 419* 389 375 326   Basic Metabolic Panel: Recent Labs  Lab 02/29/24 1906 03/01/24 0231 03/02/24 0937 03/03/24 0402  NA 139 137 138 139  K 4.1 3.6 3.5 3.8  CL 109 110 111 109  CO2 15* 15* 14* 14*  GLUCOSE 93 103* 132* 97  BUN 85* 79* 61* 51*  CREATININE 2.97* 2.83* 2.30* 2.14*  CALCIUM 9.8 9.3 9.6 9.7  MG  --   --  1.6* 1.5*  PHOS  --   --   --  1.8*   GFR: Estimated Creatinine Clearance: 41.1 mL/min (A) (by C-G formula based on SCr of 2.14 mg/dL (H)). Liver Function Tests: Recent Labs  Lab 02/29/24 1906 03/03/24 0402  AST 14*  --   ALT 11  --   ALKPHOS 195*  --   BILITOT 0.2  --   PROT 6.7  --   ALBUMIN  3.2* 2.9*   No results for input(s): LIPASE, AMYLASE in the last 168 hours. No results for input(s): AMMONIA in the last 168 hours. Coagulation Profile: No results for input(s): INR, PROTIME in the last 168 hours. Cardiac Enzymes: No results for input(s): CKTOTAL, CKMB, CKMBINDEX, TROPONINI in the last 168 hours. BNP (last 3 results) Recent Labs    02/29/24 1906  PROBNP 119.0   HbA1C: No results for input(s): HGBA1C in the last 72 hours. CBG: No results for input(s): GLUCAP in the last 168 hours. Lipid Profile: No results for input(s): CHOL, HDL, LDLCALC, TRIG, CHOLHDL, LDLDIRECT in the last 72 hours. Thyroid  Function Tests: No results for input(s): TSH, T4TOTAL, FREET4, T3FREE, THYROIDAB in the last 72 hours. Anemia Panel: Recent Labs    03/01/24 0220  VITAMINB12 281  FOLATE 5.9*  FERRITIN 112  TIBC 273  IRON 24*  RETICCTPCT  1.4   Urine analysis:    Component Value Date/Time   COLORURINE YELLOW 03/01/2024 0528   APPEARANCEUR CLOUDY (A) 03/01/2024 0528   LABSPEC 1.015 03/01/2024 0528   PHURINE 5.0 03/01/2024 0528   GLUCOSEU NEGATIVE 03/01/2024 0528   HGBUR MODERATE (A) 03/01/2024 0528   BILIRUBINUR NEGATIVE 03/01/2024 0528   KETONESUR NEGATIVE 03/01/2024 0528   PROTEINUR NEGATIVE 03/01/2024 0528   UROBILINOGEN 0.2 06/15/2011 2011   NITRITE NEGATIVE 03/01/2024 0528   LEUKOCYTESUR LARGE (A) 03/01/2024 0528   Sepsis Labs: @LABRCNTIP (procalcitonin:4,lacticidven:4)  ) Recent Results (from the past 240 hours)  Resp panel by RT-PCR (RSV, Flu A&B, Covid) Anterior Nasal Swab     Status: None   Collection Time: 02/29/24  7:13 PM   Specimen: Anterior Nasal Swab  Result Value Ref Range Status   SARS Coronavirus 2 by RT PCR NEGATIVE NEGATIVE Final   Influenza A by PCR NEGATIVE NEGATIVE Final   Influenza B by PCR NEGATIVE NEGATIVE Final    Comment: (NOTE) The Xpert Xpress SARS-CoV-2/FLU/RSV plus assay is intended as an aid in the diagnosis of influenza from Nasopharyngeal swab specimens and should not be used as a sole basis for treatment. Nasal washings and aspirates are unacceptable for Xpert Xpress SARS-CoV-2/FLU/RSV testing.  Fact Sheet for Patients: bloggercourse.com  Fact Sheet for Healthcare Providers: seriousbroker.it  This test is not yet approved or cleared by the [place]  FDA and has been authorized for detection and/or diagnosis of SARS-CoV-2 by FDA under an Emergency Use Authorization (EUA). This EUA will remain in effect (meaning this test can be used) for the duration of the COVID-19 declaration under Section 564(b)(1) of the Act, 21 U.S.C. section 360bbb-3(b)(1), unless the authorization is terminated or revoked.     Resp Syncytial Virus by PCR NEGATIVE NEGATIVE Final    Comment: (NOTE) Fact Sheet for  Patients: bloggercourse.com  Fact Sheet for Healthcare Providers: seriousbroker.it  This test is not yet approved or cleared by the [place]  FDA and has been authorized for detection and/or diagnosis of SARS-CoV-2 by FDA under an Emergency Use Authorization (EUA). This EUA will remain in effect (meaning this test can be used) for the duration of the COVID-19 declaration under Section 564(b)(1) of the Act, 21 U.S.C. section 360bbb-3(b)(1), unless the authorization is terminated or revoked.  Performed at [facility], [address], [place], [place] [address]   Culture, blood (Routine X 2) w Reflex to ID Panel     Status: None (Preliminary result)   Collection Time: 03/01/24 12:30 AM   Specimen: BLOOD RIGHT ARM  Result Value Ref Range Status  Specimen Description BLOOD RIGHT ARM  Final   Special Requests   Final    BOTTLES DRAWN AEROBIC AND ANAEROBIC Blood Culture adequate volume   Culture   Final    NO GROWTH 2 DAYS Performed at [facility], [address] [place], [place] [address]    Report Status PENDING  Incomplete  Culture, blood (Routine X 2) w Reflex to ID Panel     Status: None (Preliminary result)   Collection Time: 03/01/24  2:11 AM   Specimen: BLOOD LEFT HAND  Result Value Ref Range Status   Specimen Description BLOOD LEFT HAND  Final   Special Requests   Final    BOTTLES DRAWN AEROBIC ONLY Blood Culture results may not be optimal due to an inadequate volume of blood received in culture bottles   Culture   Final    NO GROWTH 2 DAYS Performed at [facility], [address], [place], [place] [address]    Report Status PENDING  Incomplete         Radiology Studies: No results found.       Scheduled Meds:  escitalopram   20 mg Oral Daily   heparin   5,000 Units Subcutaneous Q8H   lamoTRIgine   25 mg Oral Daily   pantoprazole   40 mg Oral Daily   sodium chloride  flush  3 mL  Intravenous Q12H   sodium chloride  flush  3 mL Intravenous Q12H   Continuous Infusions:  ceFEPime  (MAXIPIME ) IV 2 g (03/03/24 0510)   sodium bicarbonate  150 mEq in dextrose  5 % 1,150 mL infusion 75 mL/hr at 03/03/24 0706   vancomycin  750 mg (03/03/24 0558)     LOS: 2 days    Time spent: 35 minutes.    Leatrice Chapel, MD  [facility] 7PM-7AM contact night coverage as above    "

## 2024-03-03 NOTE — Plan of Care (Signed)

## 2024-03-03 NOTE — Progress Notes (Signed)
 Pharmacy Antibiotic Note  Doris Miller is a 64 y.o. female admitted on 02/29/2024 with orthopnea/generalized weakness/bilat leg swelling.  Pharmacy has been consulted for cefepime /vancomycin  dosing for cellulitis. PMH includes cellulitis of left leg.  Patient's renal function is improving (Scr 2.14) on vancomycin  750 mg Q24hr. Estimated AUC on current regimen would be 399.4. Obtained a vancomycin  random level on 1/18, which resulted at 27 about 3.5 hours after the end of the infusion. Will increase dose for improved renal function.  Plan: -Increase vancomycin  to 1000 mg IV Q24hr (Scr 2.14, Vd = 0.5, eAUC 532.6) -Continue cefepime  2g IV every 12 hours -Monitor renal function for antibiotic dose adjustments  -Follow up signs of clinical improvement, LOT, de-escalation of antibiotics   Height: 5' 5 (165.1 cm) Weight: (!) 150.1 kg (331 lb) IBW/kg (Calculated) : 57  Temp (24hrs), Avg:98.2 F (36.8 C), Min:97.8 F (36.6 C), Max:98.4 F (36.9 C)  Recent Labs  Lab 02/29/24 1906 02/29/24 1923 03/01/24 0231 03/02/24 0937 03/03/24 0402 03/03/24 1034  WBC 13.7*  --  12.6* 13.3* 11.0*  --   CREATININE 2.97*  --  2.83* 2.30* 2.14*  --   LATICACIDVEN  --  1.4  --   --   --   --   VANCORANDOM  --   --   --   --   --  27    Estimated Creatinine Clearance: 41.1 mL/min (A) (by C-G formula based on SCr of 2.14 mg/dL (H)).    Allergies[1]  Antimicrobials this admission: Cefepime  1/16 >>  Vancomycin  1/16 >>   Microbiology results: 1/16 BCx: ordered  Thank you for allowing pharmacy to be a part of this patients care.  WENDI Amon Rocher, PharmD PGY-1 Pharmacy Resident [facility] 03/03/2024 12:42 PM        [1]  Allergies Allergen Reactions   Codeine Shortness Of Breath   Diclofenac Rash

## 2024-03-03 NOTE — Plan of Care (Signed)
" °  Problem: Education: Goal: Knowledge of General Education information will improve Description: Including pain rating scale, medication(s)/side effects and non-pharmacologic comfort measures Outcome: Progressing   Problem: Clinical Measurements: Goal: Ability to maintain clinical measurements within normal limits will improve Outcome: Progressing   Problem: Activity: Goal: Risk for activity intolerance will decrease Outcome: Progressing   Problem: Nutrition: Goal: Adequate nutrition will be maintained Outcome: Progressing   Problem: Coping: Goal: Level of anxiety will decrease Outcome: Progressing   Problem: Elimination: Goal: Will not experience complications related to bowel motility Outcome: Progressing   Problem: Pain Managment: Goal: General experience of comfort will improve and/or be controlled Outcome: Progressing   Problem: Safety: Goal: Ability to remain free from injury will improve Outcome: Progressing   Problem: Skin Integrity: Goal: Risk for impaired skin integrity will decrease Outcome: Progressing   Problem: Clinical Measurements: Goal: Ability to avoid or minimize complications of infection will improve Outcome: Progressing   Problem: Skin Integrity: Goal: Skin integrity will improve Outcome: Progressing   "

## 2024-03-04 ENCOUNTER — Other Ambulatory Visit (HOSPITAL_COMMUNITY): Payer: Self-pay

## 2024-03-04 DIAGNOSIS — N179 Acute kidney failure, unspecified: Secondary | ICD-10-CM | POA: Diagnosis not present

## 2024-03-04 LAB — RENAL FUNCTION PANEL
Albumin: 2.9 g/dL — ABNORMAL LOW (ref 3.5–5.0)
Anion gap: 11 (ref 5–15)
BUN: 39 mg/dL — ABNORMAL HIGH (ref 8–23)
CO2: 23 mmol/L (ref 22–32)
Calcium: 9.3 mg/dL (ref 8.9–10.3)
Chloride: 105 mmol/L (ref 98–111)
Creatinine, Ser: 2.02 mg/dL — ABNORMAL HIGH (ref 0.44–1.00)
GFR, Estimated: 27 mL/min — ABNORMAL LOW
Glucose, Bld: 112 mg/dL — ABNORMAL HIGH (ref 70–99)
Phosphorus: 2 mg/dL — ABNORMAL LOW (ref 2.5–4.6)
Potassium: 3.1 mmol/L — ABNORMAL LOW (ref 3.5–5.1)
Sodium: 138 mmol/L (ref 135–145)

## 2024-03-04 LAB — MAGNESIUM: Magnesium: 2.2 mg/dL (ref 1.7–2.4)

## 2024-03-04 MED ORDER — POTASSIUM CHLORIDE 20 MEQ PO PACK
40.0000 meq | PACK | ORAL | Status: AC
  Administered 2024-03-04 (×2): 40 meq via ORAL
  Filled 2024-03-04 (×2): qty 2

## 2024-03-04 MED ORDER — ACIDOPHILUS 100 MG PO CAPS
100.0000 mg | ORAL_CAPSULE | Freq: Two times a day (BID) | ORAL | 0 refills | Status: DC
  Filled 2024-03-04: qty 42, 21d supply, fill #0

## 2024-03-04 MED ORDER — ACETAMINOPHEN 325 MG PO TABS: 650.0000 mg | ORAL_TABLET | Freq: Four times a day (QID) | ORAL | Status: AC | PRN

## 2024-03-04 MED ORDER — ACIDOPHILUS 100 MG PO CAPS: 100.0000 mg | ORAL_CAPSULE | Freq: Two times a day (BID) | ORAL | 0 refills | Status: AC

## 2024-03-04 MED ORDER — LINEZOLID 600 MG PO TABS
600.0000 mg | ORAL_TABLET | Freq: Two times a day (BID) | ORAL | 0 refills | Status: AC
  Filled 2024-03-04: qty 20, 10d supply, fill #0

## 2024-03-04 MED ORDER — OXYCODONE HCL 5 MG PO TABS
5.0000 mg | ORAL_TABLET | Freq: Four times a day (QID) | ORAL | 0 refills | Status: AC | PRN
  Filled 2024-03-04: qty 30, 8d supply, fill #0

## 2024-03-04 MED ORDER — ESCITALOPRAM OXALATE 20 MG PO TABS
20.0000 mg | ORAL_TABLET | Freq: Every day | ORAL | 0 refills | Status: DC
  Filled 2024-03-04: qty 90, 90d supply, fill #0

## 2024-03-04 MED ORDER — AMOXICILLIN-POT CLAVULANATE 875-125 MG PO TABS
1.0000 | ORAL_TABLET | Freq: Two times a day (BID) | ORAL | 0 refills | Status: AC
  Filled 2024-03-04: qty 20, 10d supply, fill #0

## 2024-03-04 NOTE — Discharge Summary (Signed)
 Physician Discharge Summary  Patient ID: Doris Miller MRN: [id number] DOB/AGE: January 30, 1961 64 y.o.  Admit date: 02/29/2024 Discharge date: 03/04/2024  Admission Diagnoses:  Discharge Diagnoses:  Principal Problem:   AKI (acute kidney injury) Active Problems:   Morbid obesity with body mass index (BMI) of 50.0 to 59.9 in adult [name])   GERD (gastroesophageal reflux disease)   Lower extremity cellulitis   Essential hypertension   Metabolic acidosis   Normocytic anemia   Chronic acquired lymphedema   Anxiety and depression   Acute kidney injury   Discharged Condition: {condition:18240}  Hospital Course: ***  Consults: {consultation:18241}  Significant Diagnostic Studies: {diagnostics:18242}  Treatments: {Tx:18249}  Discharge Exam: Blood pressure 102/63, pulse 84, temperature 98.1 F (36.7 C), resp. rate 18, height 5' 5 (1.651 m), weight (!) 150.1 kg, last menstrual period 06/13/2011, SpO2 97%. {physical zkjf:6958869}  Disposition: Discharge disposition: 01-Home or Self Care       Discharge Instructions     Increase activity slowly   Complete by: As directed       Allergies as of 03/04/2024       Reactions   Codeine Shortness Of Breath   Diclofenac Rash        Medication List     STOP taking these medications    escitalopram  20 MG tablet Commonly known as: LEXAPRO    lisinopril -hydrochlorothiazide  10-12.5 MG tablet Commonly known as: ZESTORETIC        TAKE these medications    acetaminophen  325 MG tablet Commonly known as: TYLENOL  Take 2 tablets (650 mg total) by mouth every 6 (six) hours as needed for mild pain (pain score 1-3) or fever (or Fever >/= 101).   Acidophilus 100 MG Caps Take 1 capsule (100 mg total) by mouth 2 (two) times daily for 21 days.   albuterol  108 (90 Base) MCG/ACT inhaler Commonly known as: VENTOLIN  HFA Inhale 1-2 puffs into the lungs every 6 (six) hours as needed for wheezing or shortness of breath.    amoxicillin -clavulanate 875-125 MG tablet Commonly known as: AUGMENTIN  Take 1 tablet by mouth 2 (two) times daily for 10 days.   lamoTRIgine  25 MG tablet Commonly known as: LAMICTAL  Take 1 tablet by mouth once daily   linezolid  600 MG tablet Commonly known as: ZYVOX  Take 1 tablet (600 mg total) by mouth 2 (two) times daily for 10 days.   omeprazole  20 MG capsule Commonly known as: PRILOSEC Take 1 capsule by mouth once daily   oxyCODONE  5 MG immediate release tablet Commonly known as: Oxy IR/ROXICODONE  Take 1 tablet (5 mg total) by mouth every 6 (six) hours as needed for severe pain (pain score 7-10) (Pain).         SignedBETHA Leatrice LILLETTE Miller 03/04/2024, 3:13 PM

## 2024-03-04 NOTE — Progress Notes (Signed)
 Occupational Therapy Treatment Patient Details Name: Doris Miller MRN: [id number] DOB: [birth year] Today's Date: 03/04/2024   History of present illness 64 y.o. female admitted 02/29/24 with SOB, weakness, BLE swelling/weeping. Workup for AKI, BLE cellulitis. PMH includes obesity, cellilitis, lymphedema, HTN, depression, anxiety.   OT comments  Pt making good progress toward all goals. Pt is mod I to supervision with all adls at this time. Pt becomes SOB quickly which has been the case for some time now. Reviewed energy conservation techniques and handout provided. Pt states husband and sons can assist at home if needed. Feel pt is safe for d/c home from OT standpoint. No further OT needs recommend after acute care.  Will keep on caseload to attempt to get to mod I level of care with all adls.      If plan is discharge home, recommend the following:  A little help with bathing/dressing/bathroom;Assistance with cooking/housework   Equipment Recommendations  Tub/shower bench;Other (comment) (Pt would benefit from bariatric rollator)    Recommendations for Other Services      Precautions / Restrictions Precautions Precautions: Fall Recall of Precautions/Restrictions: Intact Restrictions Weight Bearing Restrictions Per Provider Order: No       Mobility Bed Mobility Overal bed mobility: Modified Independent             General bed mobility comments: HOB elevated    Transfers Overall transfer level: Modified independent Equipment used: Rolling walker (2 wheels), None Transfers: Sit to/from Stand Sit to Stand: Modified independent (Device/Increase time)           General transfer comment: Cues for hand placement on first attempt but after first time cues pt followed through with mod I     Balance Overall balance assessment: Needs assistance Sitting-balance support: No upper extremity supported Sitting balance-Leahy Scale: Good     Standing balance support: No upper  extremity supported, During functional activity Standing balance-Leahy Scale: Fair Standing balance comment: needs support for dynamic standing but can stand unchalleged for up to 10 seconds.                           ADL either performed or assessed with clinical judgement   ADL Overall ADL's : Needs assistance/impaired Eating/Feeding: Independent;Sitting   Grooming: Supervision/safety;Standing Grooming Details (indicate cue type and reason): Pt stood at sink propping on elbows at times to groom. Could only tolerate appx 4 minutes but says this is normal for her. States she has a stool to sit on at her bathroom.         Upper Body Dressing : Supervision/safety;Sitting   Lower Body Dressing: Supervision/safety;Sit to/from stand Lower Body Dressing Details (indicate cue type and reason): wears house dressses and slip on shoes at home so does not have to donn socks or tie shoes Toilet Transfer: Supervision/safety;Ambulation;Comfort height toilet;Rolling walker (2 wheels);Grab bars   Toileting- Clothing Manipulation and Hygiene: Supervision/safety;Sit to/from stand     Tub/Shower Transfer Details (indicate cue type and reason): discussed shower transfers for home. Pt often sponge bathes but has seat in shower at home she uses when getting in shower. Functional mobility during ADLs: Supervision/safety;Rolling walker (2 wheels) General ADL Comments: Decreased activity tolerance.    Extremity/Trunk Assessment Upper Extremity Assessment Upper Extremity Assessment: Overall WFL for tasks assessed   Lower Extremity Assessment Lower Extremity Assessment: Defer to PT evaluation        Vision   Vision Assessment?: Wears glasses for reading   Perception  Perception Perception: Within Functional Limits   Praxis Praxis Praxis: WFL   Communication Communication Communication: No apparent difficulties   Cognition Arousal: Alert Behavior During Therapy: WFL for tasks  assessed/performed Cognition: No apparent impairments                               Following commands: Intact        Cueing      Exercises      Shoulder Instructions       General Comments Pt most limited by poor activity tolerance. Discussed energy conservation techniques at length and handouts provided.    Pertinent Vitals/ Pain       Pain Assessment Pain Assessment: No/denies pain  Home Living                                          Prior Functioning/Environment              Frequency  Min 2X/week        Progress Toward Goals  OT Goals(current goals can now be found in the care plan section)  Progress towards OT goals: Progressing toward goals  Acute Rehab OT Goals OT Goal Formulation: With patient Time For Goal Achievement: 03/15/24 Potential to Achieve Goals: Good ADL Goals Pt Will Perform Grooming: with modified independence;standing Pt Will Perform Lower Body Bathing: with modified independence;sit to/from stand Pt Will Perform Lower Body Dressing: with modified independence;sit to/from stand Pt Will Transfer to Toilet: with modified independence;ambulating Pt Will Perform Toileting - Clothing Manipulation and hygiene: with modified independence;sit to/from stand Pt Will Perform Tub/Shower Transfer: Tub transfer;with min assist;ambulating;tub bench Additional ADL Goal #1: Pt will state at least 3 energy conservations strategies as instructed.  Plan      Co-evaluation                 AM-PAC OT 6 Clicks Daily Activity     Outcome Measure   Help from another person eating meals?: None Help from another person taking care of personal grooming?: A Little Help from another person toileting, which includes using toliet, bedpan, or urinal?: A Little Help from another person bathing (including washing, rinsing, drying)?: A Little Help from another person to put on and taking off regular upper body clothing?: A  Little Help from another person to put on and taking off regular lower body clothing?: A Little 6 Click Score: 19    End of Session Equipment Utilized During Treatment: Rolling walker (2 wheels)  OT Visit Diagnosis: Unsteadiness on feet (R26.81);Other abnormalities of gait and mobility (R26.89)   Activity Tolerance Patient limited by fatigue   Patient Left in chair;with call bell/phone within reach   Nurse Communication Mobility status        Time: 9184-9167 OT Time Calculation (min): 17 min  Charges: OT General Charges $OT Visit: 1 Visit OT Treatments $Self Care/Home Management : 8-22 mins   Joshua Silvano Dragon 03/04/2024, 8:40 AM

## 2024-03-05 ENCOUNTER — Telehealth: Payer: Self-pay

## 2024-03-05 NOTE — Transitions of Care (Post Inpatient/ED Visit) (Signed)
" ° °  03/05/2024  Name: Doris Miller MRN: [id number] DOB: Dec 14, 1960  Today's TOC FU Call Status: Today's TOC FU Call Status:: Successful TOC FU Call Completed TOC FU Call Complete Date: 03/05/24  Patient's Name and Date of Birth confirmed. Name, DOB  Transition Care Management Follow-up Telephone Call Date of Discharge: 03/04/24 Discharge Facility: [facility]) Type of Discharge: Inpatient Admission Primary Inpatient Discharge Diagnosis:: edema How have you been since you were released from the hospital?: Better Any questions or concerns?: No  Items Reviewed: Did you receive and understand the discharge instructions provided?: Yes Medications obtained,verified, and reconciled?: Yes (Medications Reviewed) Any new allergies since your discharge?: No Dietary orders reviewed?: Yes Do you have support at home?: Yes People in Home [RPT]: spouse  Medications Reviewed Today: Medications Reviewed Today     Reviewed by Emmitt Pan, LPN (Licensed Practical Nurse) on 03/05/24 at 1007  Med List Status: <None>   Medication Order Taking? Sig Documenting Provider Last Dose Status Informant  acetaminophen  (TYLENOL ) 325 MG tablet [id number] Yes Take 2 tablets (650 mg total) by mouth every 6 (six) hours as needed for mild pain (pain score 1-3) or fever (or Fever >/= 101). Rosario Leatrice FERNS, MD  Active   albuterol  (VENTOLIN  HFA) 108 [phone] Base) MCG/ACT inhaler [id number]  Inhale 1-2 puffs into the lungs every 6 (six) hours as needed for wheezing or shortness of breath.  Patient not taking: Reported on 03/05/2024   Moishe Chiquita HERO, NP  Active Self, Pharmacy Records  amoxicillin -clavulanate (AUGMENTIN ) 875-125 MG tablet [id number] Yes Take 1 tablet by mouth 2 (two) times daily for 10 days. Rosario Leatrice I, MD  Active   Lactobacillus (ACIDOPHILUS) 100 MG CAPS [id number] Yes Take 1 capsule (100 mg total) by mouth 2 (two) times daily for 21 days. Rosario Leatrice FERNS, MD  Active   lamoTRIgine   (LAMICTAL ) 25 MG tablet [id number] Yes Take 1 tablet by mouth once daily Del Orbe Polanco, Iliana, FNP  Active Self, Pharmacy Records  linezolid  (ZYVOX ) 600 MG tablet [id number] Yes Take 1 tablet (600 mg total) by mouth 2 (two) times daily for 10 days. Rosario Leatrice FERNS, MD  Active   omeprazole  (PRILOSEC) 20 MG capsule [id number] Yes Take 1 capsule by mouth once daily Del Orbe Polanco, [place], FNP  Active Self, Pharmacy Records  oxyCODONE  (OXY IR/ROXICODONE ) 5 MG immediate release tablet [id number] Yes Take 1 tablet (5 mg total) by mouth every 6 (six) hours as needed for severe pain (pain score 7-10) (Pain). Rosario Leatrice FERNS, MD  Active             Home Care and Equipment/Supplies: Were Home Health Services Ordered?: NA Any new equipment or medical supplies ordered?: NA  Functional Questionnaire: Do you need assistance with bathing/showering or dressing?: No Do you need assistance with meal preparation?: No Do you need assistance with eating?: No Do you have difficulty maintaining continence: No Do you need assistance with getting out of bed/getting out of a chair/moving?: No Do you have difficulty managing or taking your medications?: No  Follow up appointments reviewed: PCP Follow-up appointment confirmed?: Yes Date of PCP follow-up appointment?: 03/13/24 Follow-up Provider: [facility] Follow-up appointment confirmed?: NA Do you need transportation to your follow-up appointment?: No Do you understand care options if your condition(s) worsen?: Yes-patient verbalized understanding    SIGNATURE Pan Emmitt, LPN [facility] Nurse Health Advisor Direct Dial [phone]  "

## 2024-03-06 LAB — CULTURE, BLOOD (ROUTINE X 2)
Culture: NO GROWTH
Culture: NO GROWTH
Special Requests: ADEQUATE

## 2024-03-13 ENCOUNTER — Other Ambulatory Visit: Payer: Self-pay

## 2024-03-13 ENCOUNTER — Encounter (HOSPITAL_COMMUNITY): Payer: Self-pay

## 2024-03-13 ENCOUNTER — Inpatient Hospital Stay: Payer: Self-pay | Admitting: Family Medicine

## 2024-03-13 ENCOUNTER — Telehealth: Payer: Self-pay

## 2024-03-13 ENCOUNTER — Telehealth: Payer: Self-pay | Admitting: Family Medicine

## 2024-03-13 ENCOUNTER — Emergency Department (HOSPITAL_COMMUNITY)
Admission: EM | Admit: 2024-03-13 | Discharge: 2024-03-13 | Disposition: A | Discharge status: Home / Self Care | Attending: Emergency Medicine | Admitting: Emergency Medicine

## 2024-03-13 DIAGNOSIS — R7989 Other specified abnormal findings of blood chemistry: Secondary | ICD-10-CM | POA: Insufficient documentation

## 2024-03-13 DIAGNOSIS — S80811A Abrasion, right lower leg, initial encounter: Secondary | ICD-10-CM | POA: Diagnosis not present

## 2024-03-13 DIAGNOSIS — W000XXA Fall on same level due to ice and snow, initial encounter: Secondary | ICD-10-CM | POA: Insufficient documentation

## 2024-03-13 DIAGNOSIS — S8991XA Unspecified injury of right lower leg, initial encounter: Secondary | ICD-10-CM | POA: Diagnosis present

## 2024-03-13 LAB — CBC WITH DIFFERENTIAL/PLATELET
Abs Immature Granulocytes: 0.03 10*3/uL (ref 0.00–0.07)
Basophils Absolute: 0.1 10*3/uL (ref 0.0–0.1)
Basophils Relative: 1 %
Eosinophils Absolute: 0 10*3/uL (ref 0.0–0.5)
Eosinophils Relative: 1 %
HCT: 32.2 % — ABNORMAL LOW (ref 36.0–46.0)
Hemoglobin: 9.2 g/dL — ABNORMAL LOW (ref 12.0–15.0)
Immature Granulocytes: 0 %
Lymphocytes Relative: 15 %
Lymphs Abs: 1.1 10*3/uL (ref 0.7–4.0)
MCH: 24.7 pg — ABNORMAL LOW (ref 26.0–34.0)
MCHC: 28.6 g/dL — ABNORMAL LOW (ref 30.0–36.0)
MCV: 86.6 fL (ref 80.0–100.0)
Monocytes Absolute: 0.4 10*3/uL (ref 0.1–1.0)
Monocytes Relative: 5 %
Neutro Abs: 5.9 10*3/uL (ref 1.7–7.7)
Neutrophils Relative %: 78 %
Platelets: 308 10*3/uL (ref 150–400)
RBC: 3.72 MIL/uL — ABNORMAL LOW (ref 3.87–5.11)
RDW: 19.5 % — ABNORMAL HIGH (ref 11.5–15.5)
WBC: 7.5 10*3/uL (ref 4.0–10.5)
nRBC: 0 % (ref 0.0–0.2)

## 2024-03-13 LAB — BASIC METABOLIC PANEL WITH GFR
Anion gap: 16 — ABNORMAL HIGH (ref 5–15)
BUN: 29 mg/dL — ABNORMAL HIGH (ref 8–23)
CO2: 19 mmol/L — ABNORMAL LOW (ref 22–32)
Calcium: 9.7 mg/dL (ref 8.9–10.3)
Chloride: 106 mmol/L (ref 98–111)
Creatinine, Ser: 1.93 mg/dL — ABNORMAL HIGH (ref 0.44–1.00)
GFR, Estimated: 29 mL/min — ABNORMAL LOW
Glucose, Bld: 92 mg/dL (ref 70–99)
Potassium: 3.9 mmol/L (ref 3.5–5.1)
Sodium: 142 mmol/L (ref 135–145)

## 2024-03-13 NOTE — Discharge Instructions (Signed)
 Continue taking your antibiotics as directed.  Keep the abrasion of your lower leg clean with mild soap and water.  You may keep it bandaged if needed.  Your kidney function seems to be improving since your recent hospitalization.  I recommend that you continue drinking plenty of water.  Please follow-up with your primary care provider later this week for recheck.  Return to emergency department for any new or worsening symptoms

## 2024-03-13 NOTE — Telephone Encounter (Signed)
 Doris Miller came into our office at 11:42 am,  said patient fell at home on the ice hurting her leg again, patient unable to stand the family called EMS.  when EMS arrived the patient refused the ride to ED so EMS worker help her get into car to come to [facility] office which her appt was at 11 am and 42 minutes late. Per nurse was told to go to ED.

## 2024-03-13 NOTE — ED Triage Notes (Signed)
 Pt reports she slipped on the ice and the back of her right lower leg is bleeding.

## 2024-03-13 NOTE — ED Provider Notes (Signed)
 " [place] EMERGENCY DEPARTMENT AT [facility] Provider Note   CSN: [id number] Arrival date & time: 03/13/24  1151     Patient presents with: Leg Injury   Doris Miller is a 64 y.o. female.   HPI     Doris Miller is a 64 y.o. female who presents to the Emergency Department for evaluation of possible laceration of her right posterior leg.  States that she had a mechanical fall in which she slipped on ice earlier today.  She was unable to get up without assistance so EMS was called.  Patient did not want to come to the ED so she contacted her primary care's office and was told to come here for further evaluation.  Of note, patient was admitted to the hospital 2 weeks ago for AKI and lower extremity cellulitis.  She is currently taking antibiotics.  Family member at bedside is requesting blood work to check her kidney functions.  Family is also concerned that patient cellulitis is worsening.  No reported fever or chills.    Prior to Admission medications  Medication Sig Start Date End Date Taking? Authorizing Provider  acetaminophen  (TYLENOL ) 325 MG tablet Take 2 tablets (650 mg total) by mouth every 6 (six) hours as needed for mild pain (pain score 1-3) or fever (or Fever >/= 101). 03/04/24   Rosario Leatrice FERNS, MD  albuterol  (VENTOLIN  HFA) 108 (90 Base) MCG/ACT inhaler Inhale 1-2 puffs into the lungs every 6 (six) hours as needed for wheezing or shortness of breath. Patient not taking: Reported on 03/05/2024 03/08/19   Moishe Chiquita HERO, NP  amoxicillin -clavulanate (AUGMENTIN ) 875-125 MG tablet Take 1 tablet by mouth 2 (two) times daily for 10 days. 03/04/24 03/14/24  Rosario Leatrice I, MD  Lactobacillus (ACIDOPHILUS) 100 MG CAPS Take 1 capsule (100 mg total) by mouth 2 (two) times daily for 21 days. 03/04/24 03/25/24  Rosario Leatrice FERNS, MD  lamoTRIgine  (LAMICTAL ) 25 MG tablet Take 1 tablet by mouth once daily 01/31/24   Del Orbe Polanco, Iliana, FNP  linezolid  (ZYVOX ) 600 MG  tablet Take 1 tablet (600 mg total) by mouth 2 (two) times daily for 10 days. 03/04/24 03/14/24  Rosario Leatrice FERNS, MD  omeprazole  (PRILOSEC) 20 MG capsule Take 1 capsule by mouth once daily 01/02/24   Del Orbe Polanco, Iliana, FNP  oxyCODONE  (OXY IR/ROXICODONE ) 5 MG immediate release tablet Take 1 tablet (5 mg total) by mouth every 6 (six) hours as needed for severe pain (pain score 7-10) (Pain). 03/04/24   Rosario Leatrice FERNS, MD    Allergies: Codeine and Diclofenac    Review of Systems  Skin:  Positive for wound (wound right lower leg.).  All other systems reviewed and are negative.   Updated Vital Signs BP 128/72 (BP Location: Right Arm)   Pulse 90   Temp 97.9 F (36.6 C)   Resp 18   Wt (!) 159.2 kg   LMP 06/13/2011   SpO2 97%   BMI 58.41 kg/m   Physical Exam Vitals and nursing note reviewed.  Constitutional:      Appearance: She is not ill-appearing or toxic-appearing.  Cardiovascular:     Rate and Rhythm: Normal rate and regular rhythm.     Pulses: Normal pulses.  Pulmonary:     Effort: Pulmonary effort is normal.     Breath sounds: Normal breath sounds.  Chest:     Chest wall: No tenderness.  Abdominal:     Palpations: Abdomen is soft.  Tenderness: There is no abdominal tenderness.  Skin:    General: Skin is warm.     Capillary Refill: Capillary refill takes less than 2 seconds.     Comments: Small amt of dried blood to the lateral right lower leg.  Small abrasion laterally.  I do not appreciate any lacerations of the LE's.  edema erythema to BLE's c/w reported cellulitis. leading edge of erythema previously marked.  Neurological:     General: No focal deficit present.     Mental Status: She is alert.     Sensory: No sensory deficit.     Motor: No weakness.     (all labs ordered are listed, but only abnormal results are displayed) Labs Reviewed  CBC WITH DIFFERENTIAL/PLATELET - Abnormal; Notable for the following components:      Result Value   RBC 3.72  (*)    Hemoglobin 9.2 (*)    HCT 32.2 (*)    MCH 24.7 (*)    MCHC 28.6 (*)    RDW 19.5 (*)    All other components within normal limits  BASIC METABOLIC PANEL WITH GFR - Abnormal; Notable for the following components:   CO2 19 (*)    BUN 29 (*)    Creatinine, Ser 1.93 (*)    GFR, Estimated 29 (*)    Anion gap 16 (*)    All other components within normal limits    EKG: None  Radiology: No results found.   Procedures   Medications Ordered in the ED - No data to display                                  Medical Decision Making   Patient here from home for evaluation of fall from earlier today where she slipped on ice.  She was told that she had a cut to the back of her right lower leg.  Patient was unable to get up without assistance so EMS was called.  Patient contacted her PCPs office she did not want to come to the emergency department initially but was advised to do so by PCPs office.  Patient's family member at bedside also requesting all labs to check her kidney function as she was recently hospitalized with an AKI.  She is currently taking antibiotics for lower extremity cellulitis  On my exam, patient well-appearing nontoxic.  Her vital signs are reassuring she does have some bilateral lower extremity lymphedema and erythema.  The leading edge of erythema appears to be previously marked.  There is no significant increase in her erythema.  She denies any fever or chills  Will check her kidney function today.  On examination of her lower extremities she has an abrasion to the lateral lower right leg but I do not appreciate any lacerations to her lower extremities.  Wounds were cleaned and bandaged  Amount and/or Complexity of Data Reviewed Labs: ordered.    Details: Labs no leukocytosis, her BUN and creatinine are still elevated but improving from baseline Discussion of management or test interpretation with external provider(s):   Wound cleaned and bandaged.  Patient  informed of lab values.  She will follow-up closely outpatient with her PCP.  Recommended to continue her antibiotics as directed and increase her water intake.  Appropriate for discharge home        Final diagnoses:  Abrasion of right lower leg, initial encounter    ED Discharge Orders  None          Herlinda Milling, PA-C 03/13/24 1652  "

## 2024-03-13 NOTE — Telephone Encounter (Signed)
 Documentation by nurse is reviewed and I agree

## 2024-03-13 NOTE — Telephone Encounter (Signed)
 Pt came to appointment 30 minutes late. She reports that she fell at home on the ice and hurt her leg. Pt had to call EMS to help her up and get into her car as she cannot walk due to the severe pain. I advised her she needed to seek ED or UC to e evaluated for possible fracture or breaks. Pt refused, I then reported this to Dr. Antonetta who then reiterated that pt needs to seek ED.

## 2024-03-13 NOTE — ED Notes (Signed)
 Pt complains of RLE bleeding after slipping and falling on ice today.

## 2024-03-18 ENCOUNTER — Encounter: Payer: Self-pay | Admitting: Family Medicine

## 2024-03-18 ENCOUNTER — Telehealth: Payer: Self-pay | Admitting: Family Medicine

## 2024-03-18 VITALS — BP 100/71 | Ht 65.0 in | Wt 331.0 lb

## 2024-03-18 MED ORDER — HYDROXYZINE PAMOATE 25 MG PO CAPS: 25.0000 mg | ORAL_CAPSULE | Freq: Three times a day (TID) | ORAL | 1 refills | Status: AC | PRN

## 2024-03-21 DIAGNOSIS — S81801A Unspecified open wound, right lower leg, initial encounter: Secondary | ICD-10-CM | POA: Insufficient documentation

## 2024-03-21 DIAGNOSIS — G479 Sleep disorder, unspecified: Secondary | ICD-10-CM | POA: Insufficient documentation

## 2024-03-21 NOTE — Assessment & Plan Note (Signed)
 Encouraged to continue treatment regimen as is Referral placed to wound care

## 2024-03-21 NOTE — Assessment & Plan Note (Signed)
 Sleep Disturbance Start taking Hydroxyzine  25 mg as needed for sleep. You may increase the dose to 50 mg nightly after two weeks if tolerated and additional benefit is needed. Alternatively, if sensitivity or side effects occur, you may reduce the dose by taking half of a 25 mg tablet (12.5 mg).  Monitor for daytime drowsiness or other side effects, and follow up if sleep issues persist.  Please monitor for side effects, including:  Drowsiness or grogginess the next day Dizziness or lightheadedness Dry mouth Changes in mood or alertness Follow up if side effects become bothersome or if sleep disturbances persist.

## 2024-03-27 ENCOUNTER — Ambulatory Visit (HOSPITAL_COMMUNITY): Admitting: Physical Therapy

## 2024-03-29 ENCOUNTER — Ambulatory Visit (HOSPITAL_COMMUNITY)

## 2024-04-02 ENCOUNTER — Ambulatory Visit (HOSPITAL_COMMUNITY): Admitting: Physical Therapy

## 2024-04-04 ENCOUNTER — Ambulatory Visit (HOSPITAL_COMMUNITY): Admitting: Physical Therapy

## 2024-04-09 ENCOUNTER — Ambulatory Visit (HOSPITAL_COMMUNITY): Admitting: Physical Therapy

## 2024-04-11 ENCOUNTER — Ambulatory Visit (HOSPITAL_COMMUNITY): Admitting: Physical Therapy

## 2024-04-16 ENCOUNTER — Ambulatory Visit (HOSPITAL_COMMUNITY): Admitting: Physical Therapy

## 2024-04-18 ENCOUNTER — Ambulatory Visit (HOSPITAL_COMMUNITY): Admitting: Physical Therapy
